# Patient Record
Sex: Female | Born: 1950
Health system: Southern US, Community
[De-identification: ages and names within clinical notes are randomized; demographics above are authoritative.]

## PROBLEM LIST (undated history)

## (undated) DIAGNOSIS — E119 Type 2 diabetes mellitus without complications: Secondary | ICD-10-CM

## (undated) DIAGNOSIS — E1169 Type 2 diabetes mellitus with other specified complication: Secondary | ICD-10-CM

## (undated) DIAGNOSIS — R0602 Shortness of breath: Secondary | ICD-10-CM

## (undated) DIAGNOSIS — F324 Major depressive disorder, single episode, in partial remission: Secondary | ICD-10-CM

## (undated) DIAGNOSIS — I5032 Chronic diastolic (congestive) heart failure: Secondary | ICD-10-CM

## (undated) DIAGNOSIS — R079 Chest pain, unspecified: Secondary | ICD-10-CM

## (undated) DIAGNOSIS — R0982 Postnasal drip: Secondary | ICD-10-CM

## (undated) DIAGNOSIS — I119 Hypertensive heart disease without heart failure: Secondary | ICD-10-CM

## (undated) DIAGNOSIS — H3530 Unspecified macular degeneration: Secondary | ICD-10-CM

## (undated) DIAGNOSIS — K219 Gastro-esophageal reflux disease without esophagitis: Secondary | ICD-10-CM

## (undated) DIAGNOSIS — I10 Essential (primary) hypertension: Secondary | ICD-10-CM

## (undated) DIAGNOSIS — R112 Nausea with vomiting, unspecified: Secondary | ICD-10-CM

## (undated) DIAGNOSIS — K5909 Other constipation: Secondary | ICD-10-CM

## (undated) DIAGNOSIS — M940 Chondrocostal junction syndrome [Tietze]: Secondary | ICD-10-CM

## (undated) DIAGNOSIS — I249 Acute ischemic heart disease, unspecified: Secondary | ICD-10-CM

## (undated) DIAGNOSIS — C50919 Malignant neoplasm of unspecified site of unspecified female breast: Secondary | ICD-10-CM

## (undated) DIAGNOSIS — E785 Hyperlipidemia, unspecified: Secondary | ICD-10-CM

## (undated) HISTORY — DX: Tobacco use: Z72.0

## (undated) HISTORY — DX: Hyperlipidemia, unspecified: E78.5

## (undated) HISTORY — DX: Major depressive disorder, single episode, in partial remission: F32.4

## (undated) HISTORY — DX: Dorsopathy, unspecified: M53.9

## (undated) HISTORY — DX: Edema, unspecified: R60.9

## (undated) HISTORY — DX: Chest pain, unspecified: R07.9

## (undated) HISTORY — DX: Other constipation: K59.09

## (undated) HISTORY — DX: Malignant neoplasm of unspecified site of unspecified female breast: C50.919

## (undated) HISTORY — PX: CATARACT EXTRACTION: SUR2

## (undated) HISTORY — DX: Unspecified macular degeneration: H35.30

## (undated) HISTORY — DX: Nausea with vomiting, unspecified: R11.2

## (undated) HISTORY — DX: Type 2 diabetes mellitus with other specified complication: E11.69

## (undated) HISTORY — DX: Shortness of breath: R06.02

## (undated) HISTORY — DX: Type 2 diabetes mellitus without complications: E11.9

## (undated) HISTORY — DX: Chronic obstructive pulmonary disease, unspecified: J44.9

## (undated) HISTORY — DX: Gastro-esophageal reflux disease without esophagitis: K21.9

## (undated) HISTORY — DX: Chronic diastolic (congestive) heart failure: I50.32

## (undated) HISTORY — DX: Chondrocostal junction syndrome (tietze): M94.0

## (undated) HISTORY — DX: Allergic rhinitis, unspecified: J30.9

## (undated) HISTORY — PX: CHOLECYSTECTOMY: SHX55

## (undated) HISTORY — DX: Acute ischemic heart disease, unspecified: I24.9

## (undated) HISTORY — DX: Postnasal drip: R09.82

## (undated) HISTORY — DX: Hypertensive heart disease without heart failure: I11.9

## (undated) HISTORY — DX: Essential (primary) hypertension: I10

---

## 1986-11-27 HISTORY — PX: VAGINAL HYSTERECTOMY: SHX2639

## 2003-11-28 DIAGNOSIS — C50919 Malignant neoplasm of unspecified site of unspecified female breast: Secondary | ICD-10-CM

## 2003-11-28 HISTORY — DX: Malignant neoplasm of unspecified site of unspecified female breast: C50.919

## 2012-06-08 ENCOUNTER — Other Ambulatory Visit: Payer: Self-pay | Admitting: Family Medicine

## 2013-11-27 HISTORY — PX: HERNIA REPAIR: SHX51

## 2016-06-23 DIAGNOSIS — Z86000 Personal history of in-situ neoplasm of breast: Secondary | ICD-10-CM

## 2016-06-23 DIAGNOSIS — Z170 Estrogen receptor positive status [ER+]: Secondary | ICD-10-CM

## 2017-06-22 DIAGNOSIS — Z86000 Personal history of in-situ neoplasm of breast: Secondary | ICD-10-CM | POA: Diagnosis not present

## 2017-06-22 DIAGNOSIS — Z170 Estrogen receptor positive status [ER+]: Secondary | ICD-10-CM | POA: Diagnosis not present

## 2017-09-21 ENCOUNTER — Encounter: Payer: Self-pay | Admitting: *Deleted

## 2017-09-24 DIAGNOSIS — R0602 Shortness of breath: Secondary | ICD-10-CM

## 2017-09-24 DIAGNOSIS — I119 Hypertensive heart disease without heart failure: Secondary | ICD-10-CM | POA: Insufficient documentation

## 2017-09-24 HISTORY — DX: Shortness of breath: R06.02

## 2017-09-24 HISTORY — DX: Hypertensive heart disease without heart failure: I11.9

## 2017-09-24 NOTE — Progress Notes (Signed)
Cardiology Office Note:    Date:  09/25/2017   ID:  Tracy Wagner, DOB 05-Oct-1951, MRN [id number]  PCP:  Cyndy Freeze, MD  Cardiologist:  Shirlee More, MD   Referring MD: Rochel Brome, MD  ASSESSMENT:    1. SOB (shortness of breath)   2. Hypertensive heart disease, unspecified whether heart failure present    PLAN:    In order of problems listed above:  1. Clinically appears to have heart failure despite a low  BNP level.  She will stop her calcium channel blocker start a loop diuretic and undergo echocardiogram.  I am also concerned regarding CAD with exertional chest pain myocardial perfusion imaging is ordered.  If she has high risk markers coronary angiography need to be considered. 2. Discontinue calcium channel blocker with edema heart failure start loop diuretic.  Next appointment in 4 weeks   Medication Adjustments/Labs and Tests Ordered: Current medicines are reviewed at length with the patient today.  Concerns regarding medicines are outlined above.  Orders Placed This Encounter  Procedures  . DG Chest 2 View  . MYOCARDIAL PERFUSION IMAGING  . EKG 12-Lead  . ECHOCARDIOGRAM COMPLETE   Meds ordered this encounter  Medications  . furosemide (LASIX) 20 MG tablet    Sig: Take 1 tablet (20 mg total) by mouth daily.    Dispense:  30 tablet    Refill:  3     Chief Complaint  Patient presents with  . Edema  . Hypertension    History of Present Illness:    Tracy Wagner is a 66 y.o. female with T2DM hypertension and hyperlipidemia who is being seen today for the evaluation of edema and SOB taking a CCB and Actos at the request of Cyndy Freeze MD. Actos was discontinued and her edema is improved but persistent.  With activity she is short of breath walking uphill to distance such as 50 yards stairs.  She has no orthopnea PND cough or wheezing.  She also he gets discomfort in her chest which is both momentary stabbing that she attributes to previous surgery  as well as aching in the chest left precordium moderate severity with activity relieved with rest and without radiation.  She has never had an ischemia evaluation.  She is at increased cardiovascular risk with diabetes hypertension hyperlipidemia as well as breast cancer and left chest radiation.  Further evaluation should undergo echocardiography study.   Past Medical History:  Diagnosis Date  . Allergic rhinitis with postnasal drip   . Breast cancer ([place])    lumpectomy and XRT left breast  . Chronic constipation   . COPD mixed type ([place])   . Gastroesophageal reflux disease without esophagitis   . Hypertension, benign   . Multilevel degenerative disc disease   . Pitting edema   . Tobacco abuse   . Type 2 diabetes mellitus with hyperlipidemia [facility])     Past Surgical History:  Procedure Laterality Date  . CHOLECYSTECTOMY    . HERNIA REPAIR  2015  . VAGINAL HYSTERECTOMY  1988    Current Medications: Current Meds  Medication Sig  . albuterol (PROVENTIL HFA;VENTOLIN HFA) 108 (90 Base) MCG/ACT inhaler Inhale 2 puffs into the lungs daily.  Marland Kitchen amitriptyline (ELAVIL) 25 MG tablet at bedtime.  . Blood Glucose Calibration (BAYER CONTOUR VI) by In Vitro route daily.  Marland Kitchen buPROPion (WELLBUTRIN XL) 150 MG 24 hr tablet Take 150 mg by mouth daily.  . celecoxib (CELEBREX) 200 MG capsule Take 600 mg by mouth  daily.   . dexlansoprazole (DEXILANT) 60 MG capsule Take 60 mg by mouth daily.  Marland Kitchen glucose blood test strip 1 each by Other route as needed for other. Use as instructed  . metFORMIN (GLUCOPHAGE) 1000 MG tablet Take 1,000 mg by mouth 2 (two) times daily with a meal.  . MICROLET LANCETS MISC by Does not apply route daily.  . polyethylene glycol powder (GLYCOLAX/MIRALAX) powder   . rosuvastatin (CRESTOR) 20 MG tablet Take 20 mg by mouth daily.  Marland Kitchen spironolactone (ALDACTONE) 25 MG tablet Take 25 mg by mouth daily.  . Vitamin D, Ergocalciferol, (DRISDOL) 50000 units CAPS capsule Take 50,000 Units by  mouth every 7 (seven) days.  . [DISCONTINUED] amLODipine (NORVASC) 10 MG tablet Take 10 mg by mouth daily.     Allergies:   Sulfa antibiotics   Social History   Social History  . Marital status: Married    Spouse name: N/A  . Number of children: N/A  . Years of education: N/A   Social History Main Topics  . Smoking status: Former Smoker    Quit date: 09/22/1987  . Smokeless tobacco: Never Used  . Alcohol use No  . Drug use: No  . Sexual activity: Not Asked   Other Topics Concern  . None   Social History Narrative  . None     Family History: The patient's family history includes Alzheimer's disease in her father; Diabetes in her sister; Heart attack in her father; Hypertension in her father and mother; Prostate cancer in her brother; Stroke in her father; Throat cancer in her mother.  ROS:   Review of Systems  Constitution: Negative.  HENT: Negative.   Eyes: Negative.   Cardiovascular: Positive for chest pain. Negative for claudication, cyanosis, dyspnea on exertion, irregular heartbeat, leg swelling, near-syncope, orthopnea, paroxysmal nocturnal dyspnea and syncope.  Respiratory: Positive for shortness of breath.   Endocrine: Negative.   Skin: Negative.   Musculoskeletal: Negative.   Gastrointestinal: Negative.   Genitourinary: Negative.   Neurological: Negative.   Psychiatric/Behavioral: Negative.   Allergic/Immunologic: Negative.    Please see the history of present illness.      EKGs/Labs/Other Studies Reviewed:    The following studies were reviewed today:   EKG:  EKG is  ordered today.  The ekg ordered today demonstrates sinus rhythm normal  Recent Labs: BMP normal, TSH normal, BNP 7.8, troponin < 0.01 No results found for requested labs within last 8760 hours.  Recent Lipid Panel No results found for: CHOL, TRIG, HDL, CHOLHDL, VLDL, LDLCALC, LDLDIRECT  Physical Exam:    VS:  BP 140/82 (BP Location: Right Arm, Patient Position: Sitting, Cuff Size:  Normal)   Pulse (!) 101   Ht 5\' 2"  (1.575 m)   Wt 191 lb (86.6 kg)   SpO2 96%   BMI 34.93 kg/m     Wt Readings from Last 3 Encounters:  09/25/17 191 lb (86.6 kg)     GEN:  Well nourished, well developed in no acute distress HEENT: Normal NECK: No JVD; No carotid bruits LYMPHATICS: No lymphadenopathy CARDIAC: RRR, no murmurs, rubs, gallops RESPIRATORY:  Clear to auscultation without rales, wheezing or rhonchi  ABDOMEN: Soft, non-tender, non-distended MUSCULOSKELETAL:  No edema; No deformity  SKIN: Warm and dry NEUROLOGIC:  Alert and oriented x 3 PSYCHIATRIC:  Normal affect     Signed, Shirlee More, MD  09/25/2017 5:12 PM    [facility]

## 2017-09-25 ENCOUNTER — Ambulatory Visit (INDEPENDENT_AMBULATORY_CARE_PROVIDER_SITE_OTHER): Payer: BLUE CROSS/BLUE SHIELD | Admitting: Cardiology

## 2017-09-25 ENCOUNTER — Encounter (INDEPENDENT_AMBULATORY_CARE_PROVIDER_SITE_OTHER): Payer: Self-pay

## 2017-09-25 ENCOUNTER — Ambulatory Visit (HOSPITAL_BASED_OUTPATIENT_CLINIC_OR_DEPARTMENT_OTHER)
Admission: RE | Admit: 2017-09-25 | Discharge: 2017-09-25 | Disposition: A | Discharge status: Home / Self Care | Payer: BLUE CROSS/BLUE SHIELD | Source: Ambulatory Visit | Attending: Cardiology | Admitting: Cardiology

## 2017-09-25 ENCOUNTER — Encounter: Payer: Self-pay | Admitting: Cardiology

## 2017-09-25 DIAGNOSIS — R0602 Shortness of breath: Secondary | ICD-10-CM | POA: Insufficient documentation

## 2017-09-25 DIAGNOSIS — I119 Hypertensive heart disease without heart failure: Secondary | ICD-10-CM | POA: Diagnosis not present

## 2017-09-25 DIAGNOSIS — J410 Simple chronic bronchitis: Secondary | ICD-10-CM | POA: Insufficient documentation

## 2017-09-25 DIAGNOSIS — F17200 Nicotine dependence, unspecified, uncomplicated: Secondary | ICD-10-CM | POA: Diagnosis not present

## 2017-09-25 MED ORDER — FUROSEMIDE 20 MG PO TABS: 20.0000 mg | ORAL_TABLET | Freq: Every day | ORAL | 3 refills | Status: DC

## 2017-09-25 NOTE — Patient Instructions (Signed)
Medication Instructions:  Your physician has recommended you make the following change in your medication:  STOP amlodipine START furosemide (Lasix) 20 mg daily  Labwork: None  Testing/Procedures: Your physician has requested that you have an echocardiogram. Echocardiography is a painless test that uses sound waves to create images of your heart. It provides your doctor with information about the size and shape of your heart and how well your heart's chambers and valves are working. This procedure takes approximately one hour. There are no restrictions for this procedure.  Your physician has requested that you have en exercise stress myoview. For further information please visit [email or web address]. Please follow instruction sheet, as given.  A chest x-ray takes a picture of the organs and structures inside the chest, including the heart, lungs, and blood vessels. This test can show several things, including, whether the heart is enlarges; whether fluid is building up in the lungs; and whether pacemaker / defibrillator leads are still in place.  Follow-Up: Your physician recommends that you schedule a follow-up appointment in: 4 weeks.  Any Other Special Instructions Will Be Listed Below (If Applicable).     If you need a refill on your cardiac medications before your next appointment, please call your pharmacy.

## 2017-09-27 DIAGNOSIS — E119 Type 2 diabetes mellitus without complications: Secondary | ICD-10-CM | POA: Diagnosis not present

## 2017-09-27 DIAGNOSIS — R0789 Other chest pain: Secondary | ICD-10-CM | POA: Diagnosis not present

## 2017-09-27 DIAGNOSIS — R079 Chest pain, unspecified: Secondary | ICD-10-CM | POA: Diagnosis not present

## 2017-09-28 DIAGNOSIS — I10 Essential (primary) hypertension: Secondary | ICD-10-CM | POA: Diagnosis not present

## 2017-09-28 DIAGNOSIS — I249 Acute ischemic heart disease, unspecified: Secondary | ICD-10-CM | POA: Diagnosis not present

## 2017-09-28 DIAGNOSIS — R079 Chest pain, unspecified: Secondary | ICD-10-CM | POA: Diagnosis not present

## 2017-10-03 ENCOUNTER — Telehealth (HOSPITAL_COMMUNITY): Payer: Self-pay | Admitting: *Deleted

## 2017-10-03 NOTE — Telephone Encounter (Signed)
Called to give patient instructions for upcoming nuclear test. Patient wants to cancel, states she had her tests( Nuclear and Echo) done last week in [place].  Kirstie Peri

## 2017-10-09 ENCOUNTER — Other Ambulatory Visit (HOSPITAL_COMMUNITY): Payer: BLUE CROSS/BLUE SHIELD

## 2017-10-09 ENCOUNTER — Encounter (HOSPITAL_COMMUNITY): Payer: BLUE CROSS/BLUE SHIELD

## 2017-10-11 NOTE — Progress Notes (Signed)
Cardiology Office Note:    Date:  10/12/2017   ID:  Tracy Wagner, DOB 08-13-1951, MRN [id number]  PCP:  Cyndy Freeze, MD  Cardiologist:  Shirlee More, MD    Referring MD: Cyndy Freeze, MD    ASSESSMENT:    1. Hypertensive heart disease with chronic diastolic congestive heart failure ([place])   2. Costochondritis   3. Non-intractable vomiting with nausea, unspecified vomiting type    PLAN:    In order of problems listed above:  1. Her physical examination is consistent with costochondritis.  Her recent myocardial perfusion study was normal her symptoms have been improved and she has been on a nonsteroidal anti-inflammatory drug for several months.  I have asked her to discontinue it with her nausea and vomiting and heartburn I offered her physical therapy modalities she said she does not think the symptoms are severe enough and she will use Tylenol as needed.  I do not think she requires further referral such as coronary arteriography. 2. Likely due to long-term Celebrex therapy I asked her to discontinue and continue her PPI 3. Improved no longer short of breath breath blood pressure target and she will continue her diuretic.   Next appointment: One month   Medication Adjustments/Labs and Tests Ordered: Current medicines are reviewed at length with the patient today.  Concerns regarding medicines are outlined above.  No orders of the defined types were placed in this encounter.  Meds ordered this encounter  Medications  . acetaminophen (TYLENOL) 325 MG tablet    Sig: Take 2 tablets (650 mg total) every 8 (eight) hours as needed by mouth.    Dispense:  30 tablet    Refill:  3    Chief Complaint  Patient presents with  . Hospitalization Follow-up    History of Present Illness:    Tracy Wagner is a 66 y.o. female with a hx of heart failure, T2DM hypertension and hyperlipidemia last seen 2 weeks ago.  In the interim she has met [facility] with chest pain  there is no evidence of acute coronary syndrome in both echocardiogram and myocardial perfusion study were felt to be normal although there is no discussion of whether or not she had diastolic heart failure Compliance with diet, lifestyle and medications: Yes She is improved but unsure of her diagnosis.  When I had seen her last she had evidence of diastolic heart failure I placed her on a small dose of loop diuretic and her shortness of breath and edema have resolved.  Echocardiogram shows normal left ventricular ejection fraction.  Her chest pain is markedly improved but she still has reproducible chest wall tenderness in the left side and will have offered at the time being is reassurance and symptoms worsen referral for physical therapy modalities.  She is having dyspepsia heartburn nausea and vomiting I asked her to stop the nonsteroidal anti-inflammatory drug.  In particular I do not think she needs referral for coronary angiography.  She had extensive evaluation including CTA no evidence of pulmonary embolism echocardiogram and myocardial perfusion study during recent [facility] visit.  Those records including admission discharge echo CTA labs and myocardial perfusion study reviewed prior to visit Past Medical History:  Diagnosis Date  . Allergic rhinitis with postnasal drip   . Breast cancer ([place])    lumpectomy and XRT left breast  . Chronic constipation   . COPD mixed type ([place])   . Gastroesophageal reflux disease without esophagitis   . Hypertension, benign   .  Multilevel degenerative disc disease   . Pitting edema   . Tobacco abuse   . Type 2 diabetes mellitus with hyperlipidemia [facility])     Past Surgical History:  Procedure Laterality Date  . CHOLECYSTECTOMY    . HERNIA REPAIR  2015  . VAGINAL HYSTERECTOMY  1988    Current Medications: Current Meds  Medication Sig  . albuterol (PROVENTIL HFA;VENTOLIN HFA) 108 (90 Base) MCG/ACT inhaler Inhale 2 puffs into the lungs daily.    Marland Kitchen amitriptyline (ELAVIL) 25 MG tablet at bedtime.  . Blood Glucose Calibration (BAYER CONTOUR VI) by In Vitro route daily.  Marland Kitchen buPROPion (WELLBUTRIN XL) 150 MG 24 hr tablet Take 150 mg 2 (two) times daily by mouth.   . dexlansoprazole (DEXILANT) 60 MG capsule Take 60 mg by mouth daily.  . furosemide (LASIX) 20 MG tablet Take 1 tablet (20 mg total) by mouth daily.  Marland Kitchen glucose blood test strip 1 each by Other route as needed for other. Use as instructed  . levofloxacin (LEVAQUIN) 500 MG tablet daily.  . metFORMIN (GLUCOPHAGE) 1000 MG tablet Take 1,000 mg by mouth 2 (two) times daily with a meal.  . MICROLET LANCETS MISC by Does not apply route daily.  . ondansetron (ZOFRAN) 8 MG tablet 3 (three) times daily.  . polyethylene glycol powder (GLYCOLAX/MIRALAX) powder   . rosuvastatin (CRESTOR) 20 MG tablet Take 20 mg by mouth daily.  Marland Kitchen spironolactone (ALDACTONE) 25 MG tablet Take 25 mg by mouth daily.  . Vitamin D, Ergocalciferol, (DRISDOL) 50000 units CAPS capsule Take 50,000 Units by mouth every 7 (seven) days.  . [DISCONTINUED] celecoxib (CELEBREX) 200 MG capsule Take 600 mg by mouth daily.      Allergies:   Sulfa antibiotics   Social History   Socioeconomic History  . Marital status: Married    Spouse name: Not on file  . Number of children: Not on file  . Years of education: Not on file  . Highest education level: Not on file  Social Needs  . Financial resource strain: Not on file  . Food insecurity - worry: Not on file  . Food insecurity - inability: Not on file  . Transportation needs - medical: Not on file  . Transportation needs - non-medical: Not on file  Occupational History  . Not on file  Tobacco Use  . Smoking status: Former Smoker    Last attempt to quit: 09/22/1987    Years since quitting: 30.0  . Smokeless tobacco: Never Used  Substance and Sexual Activity  . Alcohol use: No  . Drug use: No  . Sexual activity: Not on file  Other Topics Concern  . Not on file   Social History Narrative  . Not on file     Family History: The patient's family history includes Alzheimer's disease in her father; Diabetes in her sister; Heart attack in her father; Hypertension in her father and mother; Prostate cancer in her brother; Stroke in her father; Throat cancer in her mother.  She has tenderness along the costochondral junction bilaterally left greater than right ROS:   Please see the history of present illness.    All other systems reviewed and are negative.  EKGs/Labs/Other Studies Reviewed:    The following studies were reviewed today:  Echocardiogram 10/06/2017 shows mild concentric LVH normal systolic function diastolic function is not described no other valvular abnormality noted MPI, performed pharmacologically with Lexiscan shows an ejection fraction of 68% breast attenuation no ischemia normal for female EKG performed 09/27/2017  shows sinus tachycardia otherwise normal Recent Labs: 1118 CBC normal CMP normal TSH normal troponin repeat undetectable cholesterol 95 triglyceride 175 no BNP level was assessed CT of the chest shows no evidence of pulmonary embolism No results found for requested labs within last 8760 hours.  Recent Lipid Panel No results found for: CHOL, TRIG, HDL, CHOLHDL, VLDL, LDLCALC, LDLDIRECT  Physical Exam:    VS:  BP 110/70 (BP Location: Right Arm, Patient Position: Sitting, Cuff Size: Normal)   Pulse 95   Ht '5\' 2"'$  (1.575 m)   Wt 187 lb (84.8 kg)   SpO2 96%   BMI 34.20 kg/m     Wt Readings from Last 3 Encounters:  10/12/17 187 lb (84.8 kg)  09/25/17 191 lb (86.6 kg)     GEN:  Well nourished, well developed in no acute distress HEENT: Normal NECK: No JVD; No carotid bruits LYMPHATICS: No lymphadenopathy CARDIAC: RRR, no murmurs, rubs, gallops RESPIRATORY:  Clear to auscultation without rales, wheezing or rhonchi that reproduces her chest wall complaint. ABDOMEN: Soft, non-tender, non-distended MUSCULOSKELETAL:  No  edema; No deformity  SKIN: Warm and dry NEUROLOGIC:  Alert and oriented x 3 PSYCHIATRIC:  Normal affect    Signed, Shirlee More, MD  10/12/2017 3:08 PM    [facility]

## 2017-10-12 ENCOUNTER — Ambulatory Visit: Payer: BLUE CROSS/BLUE SHIELD | Admitting: Cardiology

## 2017-10-12 VITALS — BP 110/70 | HR 95 | Ht 62.0 in | Wt 187.0 lb

## 2017-10-12 DIAGNOSIS — R079 Chest pain, unspecified: Secondary | ICD-10-CM

## 2017-10-12 DIAGNOSIS — I5032 Chronic diastolic (congestive) heart failure: Secondary | ICD-10-CM

## 2017-10-12 DIAGNOSIS — M940 Chondrocostal junction syndrome [Tietze]: Secondary | ICD-10-CM

## 2017-10-12 DIAGNOSIS — I110 Hypertensive heart disease with heart failure: Secondary | ICD-10-CM | POA: Diagnosis not present

## 2017-10-12 DIAGNOSIS — R112 Nausea with vomiting, unspecified: Secondary | ICD-10-CM | POA: Insufficient documentation

## 2017-10-12 HISTORY — DX: Nausea with vomiting, unspecified: R11.2

## 2017-10-12 HISTORY — DX: Chest pain, unspecified: R07.9

## 2017-10-12 MED ORDER — ACETAMINOPHEN 325 MG PO TABS: 650.0000 mg | ORAL_TABLET | Freq: Three times a day (TID) | ORAL | 3 refills | Status: DC | PRN

## 2017-10-12 NOTE — Patient Instructions (Addendum)
Medication Instructions:  Your physician has recommended you make the following change in your medication:  STOP celebrex START over the counter Tylenol every 8 hours as needed  Labwork: None  Testing/Procedures: None  Follow-Up: Your physician recommends that you schedule a follow-up appointment in: 1 month.  Any Other Special Instructions Will Be Listed Below (If Applicable).     If you need a refill on your cardiac medications before your next appointment, please call your pharmacy.    Costochondritis Costochondritis is swelling and irritation (inflammation) of the tissue (cartilage) that connects your ribs to your breastbone (sternum). This causes pain in the front of your chest. Usually, the pain:  Starts gradually.  Is in more than one rib.  This condition usually goes away on its own over time. Follow these instructions at home:  Do not do anything that makes your pain worse.  If directed, put ice on the painful area: ? Put ice in a plastic bag. ? Place a towel between your skin and the bag. ? Leave the ice on for 20 minutes, 2-3 times a day.  If directed, put heat on the affected area as often as told by your doctor. Use the heat source that your doctor tells you to use, such as a moist heat pack or a heating pad. ? Place a towel between your skin and the heat source. ? Leave the heat on for 20-30 minutes. ? Take off the heat if your skin turns bright red. This is very important if you cannot feel pain, heat, or cold. You may have a greater risk of getting burned.  Take over-the-counter and prescription medicines only as told by your doctor.  Return to your normal activities as told by your doctor. Ask your doctor what activities are safe for you.  Keep all follow-up visits as told by your doctor. This is important. Contact a doctor if:  You have chills or a fever.  Your pain does not go away or it gets worse.  You have a cough that does not go  away. Get help right away if:  You are short of breath. This information is not intended to replace advice given to you by your health care provider. Make sure you discuss any questions you have with your health care provider. Document Released: 05/01/2008 Document Revised: 06/02/2016 Document Reviewed: 03/08/2016 Elsevier Interactive Patient Education  [employer].

## 2017-10-23 ENCOUNTER — Ambulatory Visit: Payer: BLUE CROSS/BLUE SHIELD | Admitting: Cardiology

## 2017-10-29 DIAGNOSIS — K219 Gastro-esophageal reflux disease without esophagitis: Secondary | ICD-10-CM | POA: Diagnosis not present

## 2017-10-29 DIAGNOSIS — Z6833 Body mass index (BMI) 33.0-33.9, adult: Secondary | ICD-10-CM | POA: Diagnosis not present

## 2017-10-29 DIAGNOSIS — Z760 Encounter for issue of repeat prescription: Secondary | ICD-10-CM | POA: Diagnosis not present

## 2017-10-29 DIAGNOSIS — R112 Nausea with vomiting, unspecified: Secondary | ICD-10-CM | POA: Diagnosis not present

## 2017-10-29 DIAGNOSIS — F3341 Major depressive disorder, recurrent, in partial remission: Secondary | ICD-10-CM | POA: Diagnosis not present

## 2017-11-12 ENCOUNTER — Ambulatory Visit: Payer: BLUE CROSS/BLUE SHIELD | Admitting: Cardiology

## 2017-12-10 DIAGNOSIS — J441 Chronic obstructive pulmonary disease with (acute) exacerbation: Secondary | ICD-10-CM | POA: Diagnosis not present

## 2017-12-10 DIAGNOSIS — Z6834 Body mass index (BMI) 34.0-34.9, adult: Secondary | ICD-10-CM | POA: Diagnosis not present

## 2017-12-10 DIAGNOSIS — J069 Acute upper respiratory infection, unspecified: Secondary | ICD-10-CM | POA: Diagnosis not present

## 2017-12-13 DIAGNOSIS — K219 Gastro-esophageal reflux disease without esophagitis: Secondary | ICD-10-CM | POA: Diagnosis not present

## 2017-12-13 DIAGNOSIS — Z6834 Body mass index (BMI) 34.0-34.9, adult: Secondary | ICD-10-CM | POA: Diagnosis not present

## 2017-12-13 DIAGNOSIS — R112 Nausea with vomiting, unspecified: Secondary | ICD-10-CM | POA: Diagnosis not present

## 2017-12-13 DIAGNOSIS — J069 Acute upper respiratory infection, unspecified: Secondary | ICD-10-CM | POA: Diagnosis not present

## 2017-12-25 DIAGNOSIS — Z6834 Body mass index (BMI) 34.0-34.9, adult: Secondary | ICD-10-CM | POA: Diagnosis not present

## 2017-12-25 DIAGNOSIS — R0982 Postnasal drip: Secondary | ICD-10-CM | POA: Diagnosis not present

## 2017-12-25 DIAGNOSIS — J309 Allergic rhinitis, unspecified: Secondary | ICD-10-CM | POA: Diagnosis not present

## 2017-12-25 DIAGNOSIS — R05 Cough: Secondary | ICD-10-CM | POA: Diagnosis not present

## 2017-12-28 DIAGNOSIS — Z6833 Body mass index (BMI) 33.0-33.9, adult: Secondary | ICD-10-CM | POA: Diagnosis not present

## 2017-12-28 DIAGNOSIS — J441 Chronic obstructive pulmonary disease with (acute) exacerbation: Secondary | ICD-10-CM | POA: Diagnosis not present

## 2018-01-10 DIAGNOSIS — I5032 Chronic diastolic (congestive) heart failure: Secondary | ICD-10-CM | POA: Diagnosis not present

## 2018-01-10 DIAGNOSIS — Z6833 Body mass index (BMI) 33.0-33.9, adult: Secondary | ICD-10-CM | POA: Diagnosis not present

## 2018-01-10 DIAGNOSIS — R0789 Other chest pain: Secondary | ICD-10-CM | POA: Diagnosis not present

## 2018-01-10 DIAGNOSIS — F3341 Major depressive disorder, recurrent, in partial remission: Secondary | ICD-10-CM | POA: Diagnosis not present

## 2018-01-10 DIAGNOSIS — K219 Gastro-esophageal reflux disease without esophagitis: Secondary | ICD-10-CM | POA: Diagnosis not present

## 2018-01-10 DIAGNOSIS — R1084 Generalized abdominal pain: Secondary | ICD-10-CM | POA: Diagnosis not present

## 2018-01-10 DIAGNOSIS — E1169 Type 2 diabetes mellitus with other specified complication: Secondary | ICD-10-CM | POA: Diagnosis not present

## 2018-01-10 DIAGNOSIS — E785 Hyperlipidemia, unspecified: Secondary | ICD-10-CM | POA: Diagnosis not present

## 2018-01-10 DIAGNOSIS — I110 Hypertensive heart disease with heart failure: Secondary | ICD-10-CM | POA: Diagnosis not present

## 2018-01-10 DIAGNOSIS — R0602 Shortness of breath: Secondary | ICD-10-CM | POA: Diagnosis not present

## 2018-01-15 DIAGNOSIS — K5909 Other constipation: Secondary | ICD-10-CM | POA: Insufficient documentation

## 2018-01-15 DIAGNOSIS — M940 Chondrocostal junction syndrome [Tietze]: Secondary | ICD-10-CM

## 2018-01-15 DIAGNOSIS — E119 Type 2 diabetes mellitus without complications: Secondary | ICD-10-CM

## 2018-01-15 DIAGNOSIS — E1169 Type 2 diabetes mellitus with other specified complication: Secondary | ICD-10-CM | POA: Insufficient documentation

## 2018-01-15 DIAGNOSIS — I5032 Chronic diastolic (congestive) heart failure: Secondary | ICD-10-CM

## 2018-01-15 DIAGNOSIS — K219 Gastro-esophageal reflux disease without esophagitis: Secondary | ICD-10-CM

## 2018-01-15 DIAGNOSIS — J449 Chronic obstructive pulmonary disease, unspecified: Secondary | ICD-10-CM

## 2018-01-15 DIAGNOSIS — F324 Major depressive disorder, single episode, in partial remission: Secondary | ICD-10-CM

## 2018-01-15 DIAGNOSIS — I249 Acute ischemic heart disease, unspecified: Secondary | ICD-10-CM | POA: Insufficient documentation

## 2018-01-15 HISTORY — DX: Chronic obstructive pulmonary disease, unspecified: J44.9

## 2018-01-15 HISTORY — DX: Major depressive disorder, single episode, in partial remission: F32.4

## 2018-01-15 HISTORY — DX: Chondrocostal junction syndrome (tietze): M94.0

## 2018-01-15 HISTORY — DX: Chronic diastolic (congestive) heart failure: I50.32

## 2018-01-15 HISTORY — DX: Type 2 diabetes mellitus without complications: E11.9

## 2018-01-15 HISTORY — DX: Gastro-esophageal reflux disease without esophagitis: K21.9

## 2018-01-15 HISTORY — DX: Acute ischemic heart disease, unspecified: I24.9

## 2018-01-27 DIAGNOSIS — R0789 Other chest pain: Secondary | ICD-10-CM | POA: Insufficient documentation

## 2018-01-27 DIAGNOSIS — R071 Chest pain on breathing: Secondary | ICD-10-CM

## 2018-01-27 NOTE — Progress Notes (Signed)
Cardiology Office Note:    Date:  01/28/2018   ID:  CICILIA CLINGER, DOB [birth year], MRN [id number]  PCP:  Cyndy Freeze, MD  Cardiologist:  Shirlee More, MD    Referring MD: Cyndy Freeze, MD    ASSESSMENT:    1. Chest pain, unspecified type   2. Chronic obstructive pulmonary disease, unspecified COPD type ([place])   3. Chronic diastolic heart failure ([place])   4. Hypertensive heart disease with chronic diastolic congestive heart failure ([place])    PLAN:    In order of problems listed above:  1. Presentation is consistent with recurrent costochondritis I asked her to resume her Celebrex she has at home and if refractory we can refer to physical therapy modalities.  October 2018 showed a normal myocardial perfusion study I would not repeat an ischemic evaluation at this time 2. Worsened with cough shortness of breath and bronchospasm.  Managed by PCP 3. Stable no volume overload continue her diuretic 4. Stable continue current treatment for hypertension   Next appointment: As needed   Medication Adjustments/Labs and Tests Ordered: Current medicines are reviewed at length with the patient today.  Concerns regarding medicines are outlined above.  Orders Placed This Encounter  Procedures  . EKG 12-Lead   No orders of the defined types were placed in this encounter.   Chief Complaint  Patient presents with  . New Patient (Initial Visit)    per Dr Cathi Roan to evaluate CP   . Chest Pain    History of Present Illness:    NALANIE WINIECKI is a 67 y.o. female with a hx of COPD, heart failure, T2DM hypertension and hyperlipidemia  last seen four months ago. She was seen as an out patient Dr Cathi Roan 01/10/18 with chest pain, troponin was normal.   10/12/17: ASSESSMENT:    1. Hypertensive heart disease with chronic diastolic congestive heart failure ([place])   2. Costochondritis   3. Non-intractable vomiting with nausea, unspecified vomiting type    PLAN:    1. Her  physical examination is consistent with costochondritis.  Her recent myocardial perfusion study was normal her symptoms have been improved and she has been on a nonsteroidal anti-inflammatory drug for several months.  I have asked her to discontinue it with her nausea and vomiting and heartburn I offered her physical therapy modalities she said she does not think the symptoms are severe enough and she will use Tylenol as needed.  I do not think she requires further referral such as coronary arteriography. 2. Likely due to long-term Celebrex therapy I asked her to discontinue and continue her PPI 3. Improved no longer short of breath breath blood pressure target and she will continue her diuretic.  Compliance with diet, lifestyle and medications: Yes  She has had 1 month of cough wheezing chest pain that was sharp localized predominantly over the costochondral junction of the left she also has had vomiting in the mornings.  She has had no edema orthopnea but is short of breath walking is little is 100 foot.  She had an ischemia evaluation and normal myocardial perfusion study October 2018.  Her symptoms are not exertional or anginal in nature. Past Medical History:  Diagnosis Date  . ACS (acute coronary syndrome) ([place]) 01/15/2018  . Allergic rhinitis with postnasal drip   . Breast cancer ([place])    lumpectomy and XRT left breast  . Chest pain 10/12/2017  . Chronic constipation   . Chronic diastolic heart failure ([place]) 01/15/2018  . COPD (  chronic obstructive pulmonary disease) ([place]) 01/15/2018  . COPD mixed type ([place])   . Costochondritis 01/15/2018  . DM (diabetes mellitus) ([place]) 01/15/2018  . Gastroesophageal reflux disease without esophagitis   . GERD (gastroesophageal reflux disease) 01/15/2018  . Hypertension, benign   . Hypertensive heart disease 09/24/2017  . Major depressive disorder in partial remission ([place]) 01/15/2018  . Multilevel degenerative disc disease   . Nausea and vomiting 10/12/2017   . Pitting edema   . SOB (shortness of breath) 09/24/2017  . Tobacco abuse   . Type 2 diabetes mellitus with hyperlipidemia [facility])     Past Surgical History:  Procedure Laterality Date  . CHOLECYSTECTOMY    . HERNIA REPAIR  2015  . VAGINAL HYSTERECTOMY  1988    Current Medications: Current Meds  Medication Sig  . acetaminophen (TYLENOL) 325 MG tablet Take 2 tablets (650 mg total) every 8 (eight) hours as needed by mouth.  Marland Kitchen albuterol (PROVENTIL HFA;VENTOLIN HFA) 108 (90 Base) MCG/ACT inhaler Inhale 2 puffs into the lungs daily.  Marland Kitchen amitriptyline (ELAVIL) 25 MG tablet Take 25 mg by mouth at bedtime.   Marland Kitchen amLODipine (NORVASC) 10 MG tablet Take 10 mg by mouth daily.  Marland Kitchen aspirin EC 81 MG tablet Take 81 mg by mouth daily.  . Blood Glucose Calibration (BAYER CONTOUR VI) by In Vitro route daily.  Marland Kitchen buPROPion (WELLBUTRIN XL) 150 MG 24 hr tablet Take 150 mg by mouth daily.   Marland Kitchen dexlansoprazole (DEXILANT) 60 MG capsule Take 60 mg by mouth daily.  . furosemide (LASIX) 20 MG tablet Take 1 tablet (20 mg total) by mouth daily.  Marland Kitchen glipiZIDE (GLUCOTROL) 5 MG tablet Take 5 mg by mouth daily before breakfast.  . glucose blood test strip 1 each by Other route as needed for other. Use as instructed  . metFORMIN (GLUCOPHAGE) 1000 MG tablet Take 1,000 mg by mouth 2 (two) times daily with a meal.  . MICROLET LANCETS MISC by Does not apply route daily.  . ondansetron (ZOFRAN) 8 MG tablet Take 8 mg by mouth 3 (three) times daily as needed.   . polyethylene glycol powder (GLYCOLAX/MIRALAX) powder   . rosuvastatin (CRESTOR) 20 MG tablet Take 20 mg by mouth daily.  Marland Kitchen spironolactone (ALDACTONE) 25 MG tablet Take 25 mg by mouth daily.  . Vitamin D, Ergocalciferol, (DRISDOL) 50000 units CAPS capsule Take 50,000 Units by mouth every 7 (seven) days.     Allergies:   Sulfa antibiotics   Social History   Socioeconomic History  . Marital status: Married    Spouse name: None  . Number of children: None  . Years  of education: None  . Highest education level: None  Social Needs  . Financial resource strain: None  . Food insecurity - worry: None  . Food insecurity - inability: None  . Transportation needs - medical: None  . Transportation needs - non-medical: None  Occupational History  . None  Tobacco Use  . Smoking status: Former Smoker    Last attempt to quit: 09/22/1987    Years since quitting: 30.3  . Smokeless tobacco: Never Used  Substance and Sexual Activity  . Alcohol use: No  . Drug use: No  . Sexual activity: None  Other Topics Concern  . None  Social History Narrative  . None     Family History: The patient's family history includes Alzheimer's disease in her father; Diabetes in her sister; Heart attack in her father; Hypertension in her father and mother; Prostate cancer in  her brother; Stroke in her father; Throat cancer in her mother. ROS:   Please see the history of present illness.    All other systems reviewed and are negative.  EKGs/Labs/Other Studies Reviewed:    The following studies were reviewed today: EKG 26-Jan-2018: Sinus tachycardia non specific T waves EKG:  EKG ordered today.  The ekg ordered today demonstrates Sinus tachycardia OW normal  Recent Labs: No results found for requested labs within last 8760 hours.  Recent Lipid Panel No results found for: CHOL, TRIG, HDL, CHOLHDL, VLDL, LDLCALC, LDLDIRECT  Physical Exam:    VS:  BP (!) 144/82 (BP Location: Right Arm, Patient Position: Sitting, Cuff Size: Normal)   Pulse (!) 104   Ht 5\' 2"  (1.575 m)   Wt 178 lb (80.7 kg)   SpO2 97%   BMI 32.56 kg/m     Wt Readings from Last 3 Encounters:  01/28/18 178 lb (80.7 kg)  10/12/17 187 lb (84.8 kg)  09/25/17 191 lb (86.6 kg)     GEN: COPD appearance, having cough paroxysms  Well nourished, well developed in no acute distress HEENT: Normal NECK: No JVD; No carotid bruits LYMPHATICS: No lymphadenopathy CARDIAC: RRR, no murmurs, rubs, gallops tender of  CCJ bilaterally L>R RESPIRATORY:  difuse expiratory wheezing ABDOMEN: Soft, non-tender, non-distended MUSCULOSKELETAL:  No edema; No deformity  SKIN: Warm and dry NEUROLOGIC:  Alert and oriented x 3 PSYCHIATRIC:  Normal affect    Signed, Shirlee More, MD  01/28/2018 3:01 PM    [facility]

## 2018-01-28 ENCOUNTER — Ambulatory Visit (INDEPENDENT_AMBULATORY_CARE_PROVIDER_SITE_OTHER): Payer: Medicare HMO | Admitting: Cardiology

## 2018-01-28 ENCOUNTER — Encounter: Payer: Self-pay | Admitting: Cardiology

## 2018-01-28 VITALS — BP 144/82 | HR 104 | Ht 62.0 in | Wt 178.0 lb

## 2018-01-28 DIAGNOSIS — I5032 Chronic diastolic (congestive) heart failure: Secondary | ICD-10-CM | POA: Diagnosis not present

## 2018-01-28 DIAGNOSIS — J449 Chronic obstructive pulmonary disease, unspecified: Secondary | ICD-10-CM

## 2018-01-28 DIAGNOSIS — R079 Chest pain, unspecified: Secondary | ICD-10-CM | POA: Diagnosis not present

## 2018-01-28 DIAGNOSIS — I110 Hypertensive heart disease with heart failure: Secondary | ICD-10-CM | POA: Diagnosis not present

## 2018-01-28 NOTE — Patient Instructions (Addendum)
   Medication Instructions:  Your physician recommends that you continue on your current medications as directed. Please refer to the Current Medication list given to you today.  Restart your celebrex for 2 weeks   Labwork: None  Testing/Procedures: You had an EKG today.   Follow-Up: Your physician recommends that you schedule a follow-up appointment as needed if symptoms worsen or fail to improve.  Any Other Special Instructions Will Be Listed Below (If Applicable).     If you need a refill on your cardiac medications before your next appointment, please call your pharmacy.

## 2018-02-25 ENCOUNTER — Other Ambulatory Visit: Payer: Self-pay

## 2018-02-25 DIAGNOSIS — R0602 Shortness of breath: Secondary | ICD-10-CM

## 2018-02-25 MED ORDER — FUROSEMIDE 20 MG PO TABS
20.0000 mg | ORAL_TABLET | Freq: Every day | ORAL | 3 refills | Status: DC
Start: 2018-02-25 — End: 2023-04-10

## 2018-02-26 DIAGNOSIS — F3341 Major depressive disorder, recurrent, in partial remission: Secondary | ICD-10-CM | POA: Diagnosis not present

## 2018-02-26 DIAGNOSIS — Z6832 Body mass index (BMI) 32.0-32.9, adult: Secondary | ICD-10-CM | POA: Diagnosis not present

## 2018-02-26 DIAGNOSIS — I110 Hypertensive heart disease with heart failure: Secondary | ICD-10-CM | POA: Diagnosis not present

## 2018-02-26 DIAGNOSIS — I5032 Chronic diastolic (congestive) heart failure: Secondary | ICD-10-CM | POA: Diagnosis not present

## 2018-04-03 IMAGING — DX DG CHEST 2V
2 series · 2 of 2 positions shown · non-contrast
Comparison: None in PACs

CLINICAL DATA: Shortness of breath for the past 2 weeks associated
with peripheral edema. History of COPD, former smoker, hypertension,
diabetes, breast malignancy.

EXAM:
CHEST  2 VIEW

[chest pa]
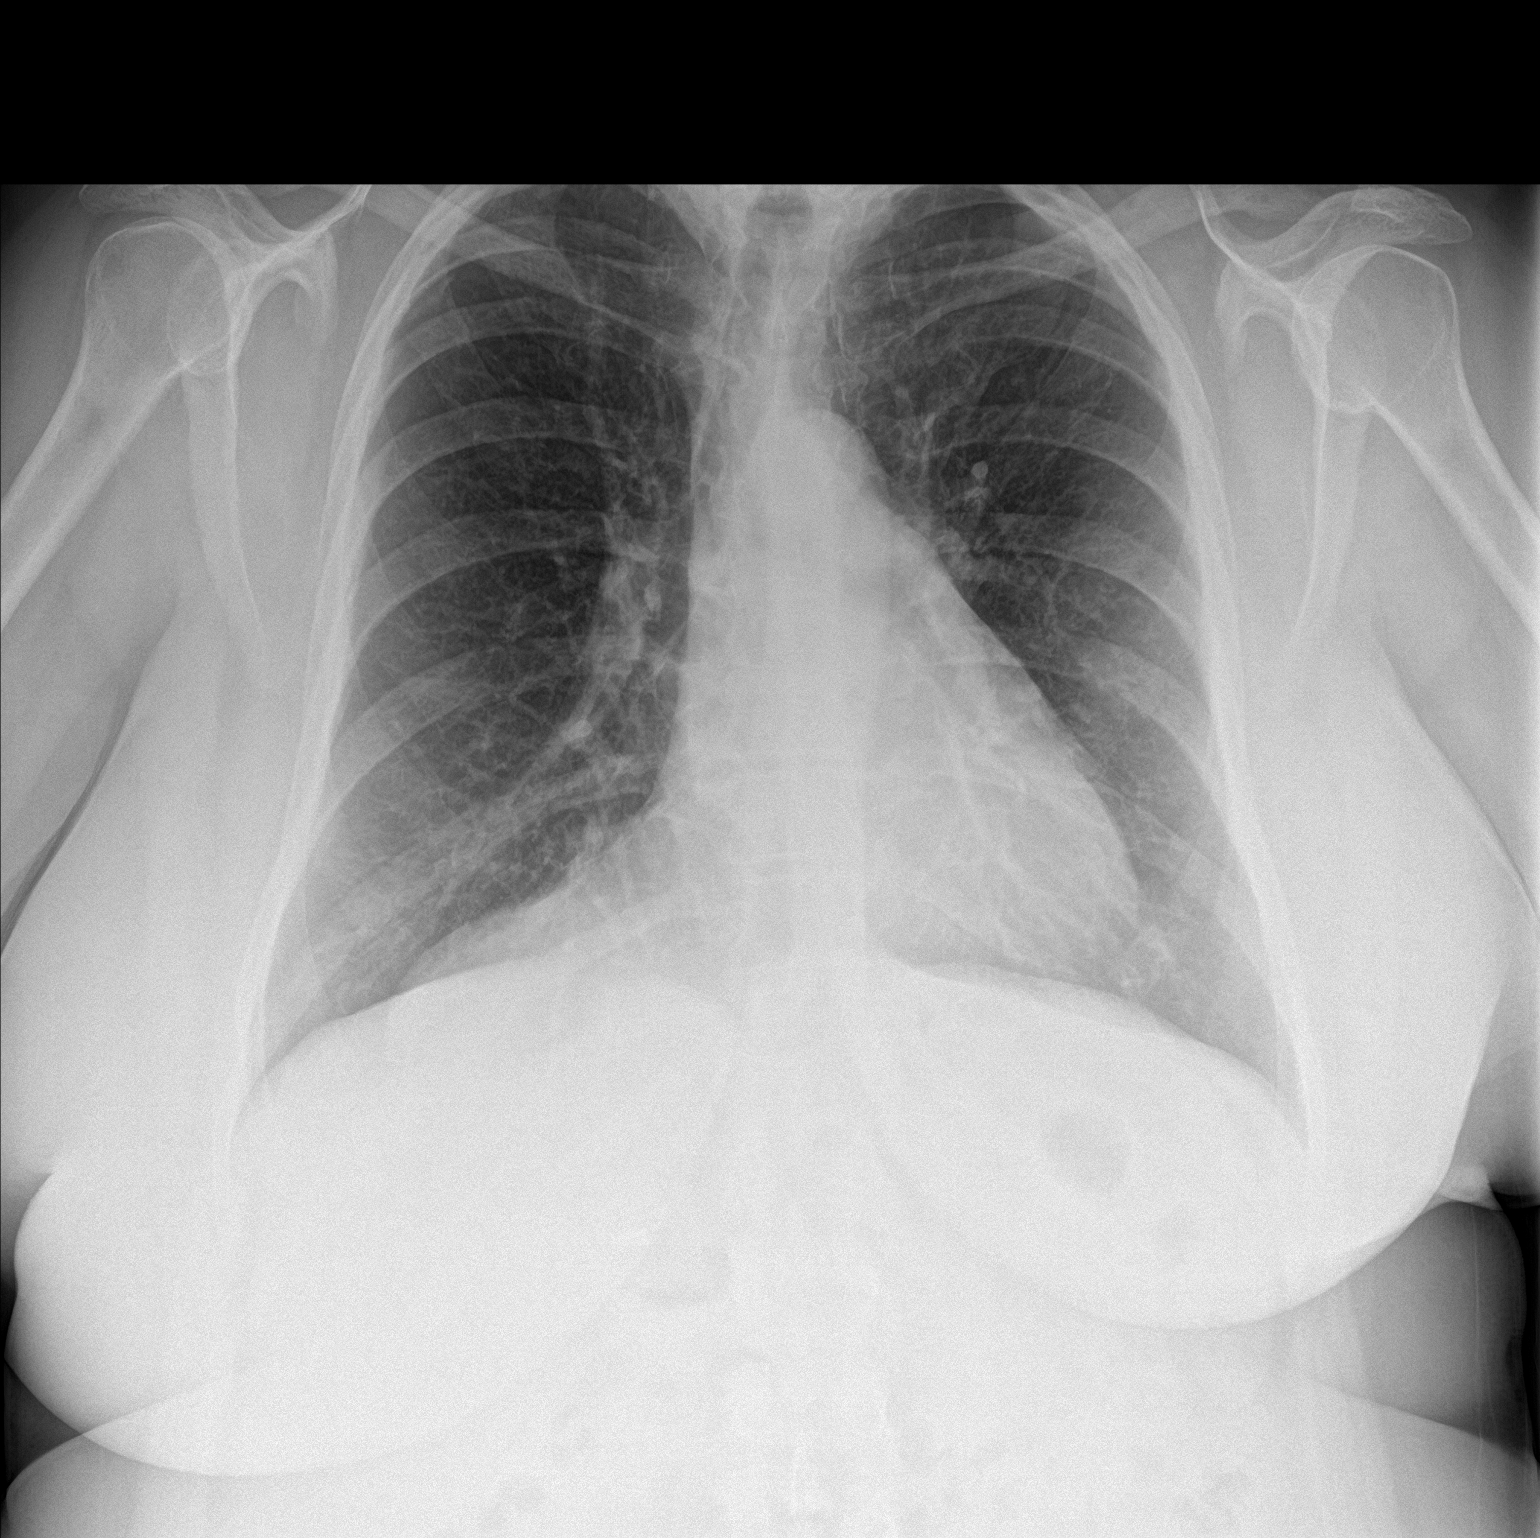

[chest lat]
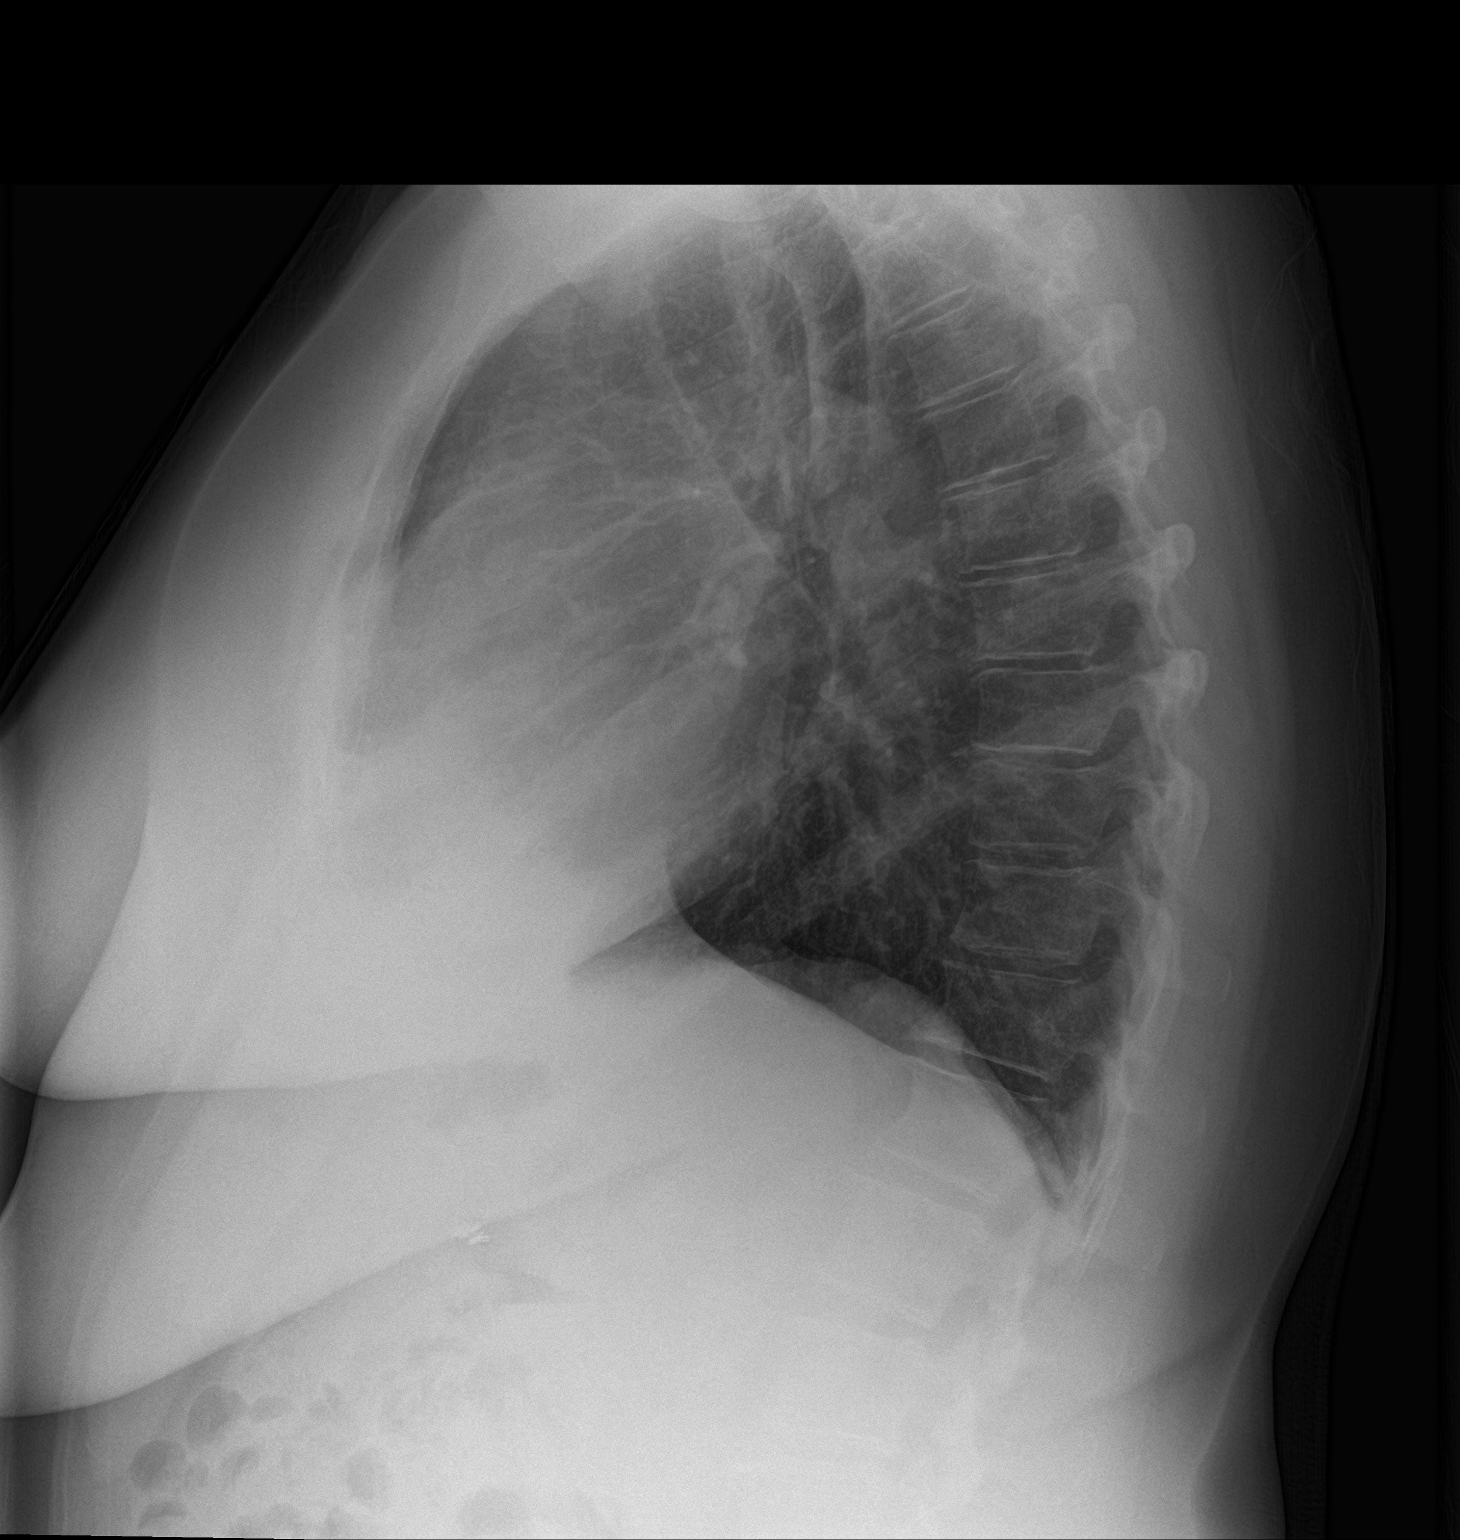

[2 of 2 positions shown; findings below may reference images not displayed]

FINDINGS: The lungs are well-expanded. There is no focal infiltrate. The
interstitial markings are mildly prominent. The heart and pulmonary
vascularity are normal. The mediastinum is normal in width. The bony
thorax is unremarkable.
IMPRESSION: Mild chronic bronchitic -smoking related changes. No pneumonia, CHF,
nor other acute cardiopulmonary abnormality.

## 2018-05-28 DIAGNOSIS — M159 Polyosteoarthritis, unspecified: Secondary | ICD-10-CM | POA: Diagnosis not present

## 2018-05-28 DIAGNOSIS — E7800 Pure hypercholesterolemia, unspecified: Secondary | ICD-10-CM | POA: Diagnosis not present

## 2018-05-28 DIAGNOSIS — I5032 Chronic diastolic (congestive) heart failure: Secondary | ICD-10-CM | POA: Diagnosis not present

## 2018-05-28 DIAGNOSIS — E1129 Type 2 diabetes mellitus with other diabetic kidney complication: Secondary | ICD-10-CM | POA: Diagnosis not present

## 2018-05-28 DIAGNOSIS — R809 Proteinuria, unspecified: Secondary | ICD-10-CM | POA: Diagnosis not present

## 2018-05-28 DIAGNOSIS — E785 Hyperlipidemia, unspecified: Secondary | ICD-10-CM | POA: Diagnosis not present

## 2018-05-28 DIAGNOSIS — I110 Hypertensive heart disease with heart failure: Secondary | ICD-10-CM | POA: Diagnosis not present

## 2018-05-28 DIAGNOSIS — E559 Vitamin D deficiency, unspecified: Secondary | ICD-10-CM | POA: Diagnosis not present

## 2018-05-28 DIAGNOSIS — E1169 Type 2 diabetes mellitus with other specified complication: Secondary | ICD-10-CM | POA: Diagnosis not present

## 2018-06-21 DIAGNOSIS — Z86000 Personal history of in-situ neoplasm of breast: Secondary | ICD-10-CM | POA: Diagnosis not present

## 2018-07-03 DIAGNOSIS — Z6831 Body mass index (BMI) 31.0-31.9, adult: Secondary | ICD-10-CM | POA: Diagnosis not present

## 2018-07-03 DIAGNOSIS — R112 Nausea with vomiting, unspecified: Secondary | ICD-10-CM | POA: Diagnosis not present

## 2018-07-03 DIAGNOSIS — K2970 Gastritis, unspecified, without bleeding: Secondary | ICD-10-CM | POA: Diagnosis not present

## 2018-08-01 DIAGNOSIS — I160 Hypertensive urgency: Secondary | ICD-10-CM | POA: Diagnosis not present

## 2018-08-15 DIAGNOSIS — K219 Gastro-esophageal reflux disease without esophagitis: Secondary | ICD-10-CM | POA: Diagnosis not present

## 2018-08-15 DIAGNOSIS — K2970 Gastritis, unspecified, without bleeding: Secondary | ICD-10-CM | POA: Diagnosis not present

## 2018-08-15 DIAGNOSIS — Z6830 Body mass index (BMI) 30.0-30.9, adult: Secondary | ICD-10-CM | POA: Diagnosis not present

## 2018-08-15 DIAGNOSIS — I110 Hypertensive heart disease with heart failure: Secondary | ICD-10-CM | POA: Diagnosis not present

## 2018-08-15 DIAGNOSIS — R112 Nausea with vomiting, unspecified: Secondary | ICD-10-CM | POA: Diagnosis not present

## 2018-08-15 DIAGNOSIS — I5032 Chronic diastolic (congestive) heart failure: Secondary | ICD-10-CM | POA: Diagnosis not present

## 2018-09-20 DIAGNOSIS — Z23 Encounter for immunization: Secondary | ICD-10-CM | POA: Diagnosis not present

## 2018-12-30 DIAGNOSIS — R300 Dysuria: Secondary | ICD-10-CM | POA: Diagnosis not present

## 2018-12-30 DIAGNOSIS — K5732 Diverticulitis of large intestine without perforation or abscess without bleeding: Secondary | ICD-10-CM | POA: Diagnosis not present

## 2018-12-30 DIAGNOSIS — E663 Overweight: Secondary | ICD-10-CM | POA: Diagnosis not present

## 2018-12-30 DIAGNOSIS — K219 Gastro-esophageal reflux disease without esophagitis: Secondary | ICD-10-CM | POA: Diagnosis not present

## 2018-12-30 DIAGNOSIS — Z6829 Body mass index (BMI) 29.0-29.9, adult: Secondary | ICD-10-CM | POA: Diagnosis not present

## 2018-12-31 DIAGNOSIS — I10 Essential (primary) hypertension: Secondary | ICD-10-CM | POA: Diagnosis not present

## 2018-12-31 DIAGNOSIS — J449 Chronic obstructive pulmonary disease, unspecified: Secondary | ICD-10-CM | POA: Diagnosis not present

## 2018-12-31 DIAGNOSIS — R300 Dysuria: Secondary | ICD-10-CM | POA: Diagnosis not present

## 2018-12-31 DIAGNOSIS — R112 Nausea with vomiting, unspecified: Secondary | ICD-10-CM | POA: Diagnosis not present

## 2019-02-04 DIAGNOSIS — R809 Proteinuria, unspecified: Secondary | ICD-10-CM | POA: Diagnosis not present

## 2019-02-04 DIAGNOSIS — E1129 Type 2 diabetes mellitus with other diabetic kidney complication: Secondary | ICD-10-CM | POA: Diagnosis not present

## 2019-02-04 DIAGNOSIS — E1169 Type 2 diabetes mellitus with other specified complication: Secondary | ICD-10-CM | POA: Diagnosis not present

## 2019-02-04 DIAGNOSIS — M159 Polyosteoarthritis, unspecified: Secondary | ICD-10-CM | POA: Diagnosis not present

## 2019-02-04 DIAGNOSIS — I5032 Chronic diastolic (congestive) heart failure: Secondary | ICD-10-CM | POA: Diagnosis not present

## 2019-02-04 DIAGNOSIS — E785 Hyperlipidemia, unspecified: Secondary | ICD-10-CM | POA: Diagnosis not present

## 2019-02-04 DIAGNOSIS — I110 Hypertensive heart disease with heart failure: Secondary | ICD-10-CM | POA: Diagnosis not present

## 2019-02-04 DIAGNOSIS — E7800 Pure hypercholesterolemia, unspecified: Secondary | ICD-10-CM | POA: Diagnosis not present

## 2019-02-04 DIAGNOSIS — K219 Gastro-esophageal reflux disease without esophagitis: Secondary | ICD-10-CM | POA: Diagnosis not present

## 2019-02-05 DIAGNOSIS — E1129 Type 2 diabetes mellitus with other diabetic kidney complication: Secondary | ICD-10-CM | POA: Diagnosis not present

## 2019-02-05 DIAGNOSIS — E785 Hyperlipidemia, unspecified: Secondary | ICD-10-CM | POA: Diagnosis not present

## 2019-03-10 DIAGNOSIS — R55 Syncope and collapse: Secondary | ICD-10-CM | POA: Diagnosis not present

## 2019-03-10 DIAGNOSIS — R42 Dizziness and giddiness: Secondary | ICD-10-CM | POA: Diagnosis not present

## 2019-03-10 DIAGNOSIS — E663 Overweight: Secondary | ICD-10-CM | POA: Diagnosis not present

## 2019-03-10 DIAGNOSIS — Z6828 Body mass index (BMI) 28.0-28.9, adult: Secondary | ICD-10-CM | POA: Diagnosis not present

## 2019-03-10 DIAGNOSIS — I10 Essential (primary) hypertension: Secondary | ICD-10-CM | POA: Diagnosis not present

## 2019-03-10 DIAGNOSIS — I5032 Chronic diastolic (congestive) heart failure: Secondary | ICD-10-CM | POA: Diagnosis not present

## 2019-05-16 DIAGNOSIS — R109 Unspecified abdominal pain: Secondary | ICD-10-CM | POA: Diagnosis not present

## 2019-05-16 DIAGNOSIS — E860 Dehydration: Secondary | ICD-10-CM | POA: Diagnosis not present

## 2019-05-16 DIAGNOSIS — R1013 Epigastric pain: Secondary | ICD-10-CM | POA: Diagnosis not present

## 2019-05-16 DIAGNOSIS — K6389 Other specified diseases of intestine: Secondary | ICD-10-CM | POA: Diagnosis not present

## 2019-05-16 DIAGNOSIS — R112 Nausea with vomiting, unspecified: Secondary | ICD-10-CM | POA: Diagnosis not present

## 2019-05-28 DIAGNOSIS — J329 Chronic sinusitis, unspecified: Secondary | ICD-10-CM | POA: Diagnosis not present

## 2019-05-28 DIAGNOSIS — J40 Bronchitis, not specified as acute or chronic: Secondary | ICD-10-CM | POA: Diagnosis not present

## 2019-07-17 DIAGNOSIS — R509 Fever, unspecified: Secondary | ICD-10-CM | POA: Diagnosis not present

## 2019-07-17 DIAGNOSIS — J989 Respiratory disorder, unspecified: Secondary | ICD-10-CM | POA: Diagnosis not present

## 2019-07-17 DIAGNOSIS — Z9181 History of falling: Secondary | ICD-10-CM | POA: Diagnosis not present

## 2019-07-23 DIAGNOSIS — M546 Pain in thoracic spine: Secondary | ICD-10-CM | POA: Diagnosis not present

## 2019-07-26 DIAGNOSIS — Z1231 Encounter for screening mammogram for malignant neoplasm of breast: Secondary | ICD-10-CM | POA: Diagnosis not present

## 2019-08-14 DIAGNOSIS — M8580 Other specified disorders of bone density and structure, unspecified site: Secondary | ICD-10-CM | POA: Diagnosis not present

## 2019-08-14 DIAGNOSIS — Z0000 Encounter for general adult medical examination without abnormal findings: Secondary | ICD-10-CM | POA: Diagnosis not present

## 2019-08-14 DIAGNOSIS — I5032 Chronic diastolic (congestive) heart failure: Secondary | ICD-10-CM | POA: Diagnosis not present

## 2019-08-14 DIAGNOSIS — F3341 Major depressive disorder, recurrent, in partial remission: Secondary | ICD-10-CM | POA: Diagnosis not present

## 2019-08-14 DIAGNOSIS — E785 Hyperlipidemia, unspecified: Secondary | ICD-10-CM | POA: Diagnosis not present

## 2019-08-14 DIAGNOSIS — E7800 Pure hypercholesterolemia, unspecified: Secondary | ICD-10-CM | POA: Diagnosis not present

## 2019-08-14 DIAGNOSIS — Z79899 Other long term (current) drug therapy: Secondary | ICD-10-CM | POA: Diagnosis not present

## 2019-08-14 DIAGNOSIS — E1169 Type 2 diabetes mellitus with other specified complication: Secondary | ICD-10-CM | POA: Diagnosis not present

## 2019-08-14 DIAGNOSIS — J449 Chronic obstructive pulmonary disease, unspecified: Secondary | ICD-10-CM | POA: Diagnosis not present

## 2019-08-14 DIAGNOSIS — E1129 Type 2 diabetes mellitus with other diabetic kidney complication: Secondary | ICD-10-CM | POA: Diagnosis not present

## 2019-08-14 DIAGNOSIS — Z23 Encounter for immunization: Secondary | ICD-10-CM | POA: Diagnosis not present

## 2019-08-14 DIAGNOSIS — R809 Proteinuria, unspecified: Secondary | ICD-10-CM | POA: Diagnosis not present

## 2019-08-14 DIAGNOSIS — I110 Hypertensive heart disease with heart failure: Secondary | ICD-10-CM | POA: Diagnosis not present

## 2019-11-18 DIAGNOSIS — I5032 Chronic diastolic (congestive) heart failure: Secondary | ICD-10-CM | POA: Diagnosis not present

## 2019-11-18 DIAGNOSIS — M159 Polyosteoarthritis, unspecified: Secondary | ICD-10-CM | POA: Diagnosis not present

## 2019-11-18 DIAGNOSIS — E7800 Pure hypercholesterolemia, unspecified: Secondary | ICD-10-CM | POA: Diagnosis not present

## 2019-11-18 DIAGNOSIS — R809 Proteinuria, unspecified: Secondary | ICD-10-CM | POA: Diagnosis not present

## 2019-11-18 DIAGNOSIS — Z79899 Other long term (current) drug therapy: Secondary | ICD-10-CM | POA: Diagnosis not present

## 2019-11-18 DIAGNOSIS — E1129 Type 2 diabetes mellitus with other diabetic kidney complication: Secondary | ICD-10-CM | POA: Diagnosis not present

## 2019-11-18 DIAGNOSIS — E785 Hyperlipidemia, unspecified: Secondary | ICD-10-CM | POA: Diagnosis not present

## 2019-11-18 DIAGNOSIS — I110 Hypertensive heart disease with heart failure: Secondary | ICD-10-CM | POA: Diagnosis not present

## 2019-11-18 DIAGNOSIS — E1169 Type 2 diabetes mellitus with other specified complication: Secondary | ICD-10-CM | POA: Diagnosis not present

## 2019-12-09 DIAGNOSIS — M791 Myalgia, unspecified site: Secondary | ICD-10-CM | POA: Diagnosis not present

## 2019-12-09 DIAGNOSIS — Z20828 Contact with and (suspected) exposure to other viral communicable diseases: Secondary | ICD-10-CM | POA: Diagnosis not present

## 2019-12-09 DIAGNOSIS — R05 Cough: Secondary | ICD-10-CM | POA: Diagnosis not present

## 2019-12-09 DIAGNOSIS — J209 Acute bronchitis, unspecified: Secondary | ICD-10-CM | POA: Diagnosis not present

## 2019-12-09 DIAGNOSIS — R071 Chest pain on breathing: Secondary | ICD-10-CM | POA: Diagnosis not present

## 2019-12-09 DIAGNOSIS — R0602 Shortness of breath: Secondary | ICD-10-CM | POA: Diagnosis not present

## 2020-01-01 DIAGNOSIS — R05 Cough: Secondary | ICD-10-CM | POA: Diagnosis not present

## 2020-01-01 DIAGNOSIS — R0600 Dyspnea, unspecified: Secondary | ICD-10-CM | POA: Diagnosis not present

## 2020-01-19 DIAGNOSIS — M159 Polyosteoarthritis, unspecified: Secondary | ICD-10-CM | POA: Diagnosis not present

## 2020-01-19 DIAGNOSIS — E7800 Pure hypercholesterolemia, unspecified: Secondary | ICD-10-CM | POA: Diagnosis not present

## 2020-01-19 DIAGNOSIS — E1129 Type 2 diabetes mellitus with other diabetic kidney complication: Secondary | ICD-10-CM | POA: Diagnosis not present

## 2020-01-19 DIAGNOSIS — R809 Proteinuria, unspecified: Secondary | ICD-10-CM | POA: Diagnosis not present

## 2020-01-19 DIAGNOSIS — I110 Hypertensive heart disease with heart failure: Secondary | ICD-10-CM | POA: Diagnosis not present

## 2020-01-19 DIAGNOSIS — E785 Hyperlipidemia, unspecified: Secondary | ICD-10-CM | POA: Diagnosis not present

## 2020-01-19 DIAGNOSIS — F3341 Major depressive disorder, recurrent, in partial remission: Secondary | ICD-10-CM | POA: Diagnosis not present

## 2020-01-19 DIAGNOSIS — J449 Chronic obstructive pulmonary disease, unspecified: Secondary | ICD-10-CM | POA: Diagnosis not present

## 2020-01-19 DIAGNOSIS — I5032 Chronic diastolic (congestive) heart failure: Secondary | ICD-10-CM | POA: Diagnosis not present

## 2020-01-19 DIAGNOSIS — N183 Chronic kidney disease, stage 3 unspecified: Secondary | ICD-10-CM | POA: Diagnosis not present

## 2020-01-19 DIAGNOSIS — E1169 Type 2 diabetes mellitus with other specified complication: Secondary | ICD-10-CM | POA: Diagnosis not present

## 2020-03-11 DIAGNOSIS — H353131 Nonexudative age-related macular degeneration, bilateral, early dry stage: Secondary | ICD-10-CM | POA: Diagnosis not present

## 2020-03-11 DIAGNOSIS — E119 Type 2 diabetes mellitus without complications: Secondary | ICD-10-CM | POA: Diagnosis not present

## 2020-03-11 DIAGNOSIS — Z0100 Encounter for examination of eyes and vision without abnormal findings: Secondary | ICD-10-CM | POA: Diagnosis not present

## 2020-05-04 DIAGNOSIS — N183 Chronic kidney disease, stage 3 unspecified: Secondary | ICD-10-CM | POA: Diagnosis not present

## 2020-05-04 DIAGNOSIS — I110 Hypertensive heart disease with heart failure: Secondary | ICD-10-CM | POA: Diagnosis not present

## 2020-05-04 DIAGNOSIS — I5032 Chronic diastolic (congestive) heart failure: Secondary | ICD-10-CM | POA: Diagnosis not present

## 2020-05-04 DIAGNOSIS — Z6830 Body mass index (BMI) 30.0-30.9, adult: Secondary | ICD-10-CM | POA: Diagnosis not present

## 2020-05-21 DIAGNOSIS — R809 Proteinuria, unspecified: Secondary | ICD-10-CM | POA: Diagnosis not present

## 2020-05-21 DIAGNOSIS — M8580 Other specified disorders of bone density and structure, unspecified site: Secondary | ICD-10-CM | POA: Diagnosis not present

## 2020-05-21 DIAGNOSIS — E785 Hyperlipidemia, unspecified: Secondary | ICD-10-CM | POA: Diagnosis not present

## 2020-05-21 DIAGNOSIS — E1129 Type 2 diabetes mellitus with other diabetic kidney complication: Secondary | ICD-10-CM | POA: Diagnosis not present

## 2020-05-21 DIAGNOSIS — Z79899 Other long term (current) drug therapy: Secondary | ICD-10-CM | POA: Diagnosis not present

## 2020-05-21 DIAGNOSIS — N183 Chronic kidney disease, stage 3 unspecified: Secondary | ICD-10-CM | POA: Diagnosis not present

## 2020-05-26 DIAGNOSIS — K219 Gastro-esophageal reflux disease without esophagitis: Secondary | ICD-10-CM | POA: Diagnosis not present

## 2020-05-26 DIAGNOSIS — J449 Chronic obstructive pulmonary disease, unspecified: Secondary | ICD-10-CM | POA: Diagnosis not present

## 2020-06-04 DIAGNOSIS — E1129 Type 2 diabetes mellitus with other diabetic kidney complication: Secondary | ICD-10-CM | POA: Diagnosis not present

## 2020-06-04 DIAGNOSIS — R809 Proteinuria, unspecified: Secondary | ICD-10-CM | POA: Diagnosis not present

## 2020-06-08 DIAGNOSIS — G4733 Obstructive sleep apnea (adult) (pediatric): Secondary | ICD-10-CM | POA: Diagnosis not present

## 2020-06-08 DIAGNOSIS — R0602 Shortness of breath: Secondary | ICD-10-CM | POA: Diagnosis not present

## 2020-06-09 DIAGNOSIS — G4733 Obstructive sleep apnea (adult) (pediatric): Secondary | ICD-10-CM | POA: Diagnosis not present

## 2020-06-09 DIAGNOSIS — R0602 Shortness of breath: Secondary | ICD-10-CM | POA: Diagnosis not present

## 2020-07-27 DIAGNOSIS — G4733 Obstructive sleep apnea (adult) (pediatric): Secondary | ICD-10-CM | POA: Diagnosis not present

## 2020-07-27 DIAGNOSIS — R400 Somnolence: Secondary | ICD-10-CM | POA: Diagnosis not present

## 2020-08-26 DIAGNOSIS — N3090 Cystitis, unspecified without hematuria: Secondary | ICD-10-CM | POA: Diagnosis not present

## 2020-08-26 DIAGNOSIS — G4733 Obstructive sleep apnea (adult) (pediatric): Secondary | ICD-10-CM | POA: Diagnosis not present

## 2020-08-26 DIAGNOSIS — N3000 Acute cystitis without hematuria: Secondary | ICD-10-CM | POA: Diagnosis not present

## 2020-08-26 DIAGNOSIS — R400 Somnolence: Secondary | ICD-10-CM | POA: Diagnosis not present

## 2020-08-26 DIAGNOSIS — R300 Dysuria: Secondary | ICD-10-CM | POA: Diagnosis not present

## 2020-10-26 DIAGNOSIS — I130 Hypertensive heart and chronic kidney disease with heart failure and stage 1 through stage 4 chronic kidney disease, or unspecified chronic kidney disease: Secondary | ICD-10-CM | POA: Diagnosis not present

## 2020-10-26 DIAGNOSIS — N183 Chronic kidney disease, stage 3 unspecified: Secondary | ICD-10-CM | POA: Diagnosis not present

## 2020-10-26 DIAGNOSIS — E1122 Type 2 diabetes mellitus with diabetic chronic kidney disease: Secondary | ICD-10-CM | POA: Diagnosis not present

## 2020-10-26 DIAGNOSIS — I5032 Chronic diastolic (congestive) heart failure: Secondary | ICD-10-CM | POA: Diagnosis not present

## 2020-11-08 DIAGNOSIS — E785 Hyperlipidemia, unspecified: Secondary | ICD-10-CM | POA: Diagnosis not present

## 2020-11-08 DIAGNOSIS — N183 Chronic kidney disease, stage 3 unspecified: Secondary | ICD-10-CM | POA: Diagnosis not present

## 2020-11-08 DIAGNOSIS — E1129 Type 2 diabetes mellitus with other diabetic kidney complication: Secondary | ICD-10-CM | POA: Diagnosis not present

## 2020-11-08 DIAGNOSIS — D519 Vitamin B12 deficiency anemia, unspecified: Secondary | ICD-10-CM | POA: Diagnosis not present

## 2020-11-08 DIAGNOSIS — E559 Vitamin D deficiency, unspecified: Secondary | ICD-10-CM | POA: Diagnosis not present

## 2020-11-08 DIAGNOSIS — Z79899 Other long term (current) drug therapy: Secondary | ICD-10-CM | POA: Diagnosis not present

## 2020-11-25 DIAGNOSIS — E1169 Type 2 diabetes mellitus with other specified complication: Secondary | ICD-10-CM | POA: Diagnosis not present

## 2020-11-25 DIAGNOSIS — K219 Gastro-esophageal reflux disease without esophagitis: Secondary | ICD-10-CM | POA: Diagnosis not present

## 2020-11-25 DIAGNOSIS — E785 Hyperlipidemia, unspecified: Secondary | ICD-10-CM | POA: Diagnosis not present

## 2020-12-20 DIAGNOSIS — E1129 Type 2 diabetes mellitus with other diabetic kidney complication: Secondary | ICD-10-CM | POA: Diagnosis not present

## 2020-12-26 DIAGNOSIS — F329 Major depressive disorder, single episode, unspecified: Secondary | ICD-10-CM | POA: Diagnosis not present

## 2020-12-26 DIAGNOSIS — M4800 Spinal stenosis, site unspecified: Secondary | ICD-10-CM | POA: Diagnosis not present

## 2020-12-26 DIAGNOSIS — I10 Essential (primary) hypertension: Secondary | ICD-10-CM | POA: Diagnosis not present

## 2021-01-05 DIAGNOSIS — R809 Proteinuria, unspecified: Secondary | ICD-10-CM | POA: Diagnosis not present

## 2021-01-05 DIAGNOSIS — N1831 Chronic kidney disease, stage 3a: Secondary | ICD-10-CM | POA: Diagnosis not present

## 2021-01-05 DIAGNOSIS — I110 Hypertensive heart disease with heart failure: Secondary | ICD-10-CM | POA: Diagnosis not present

## 2021-01-05 DIAGNOSIS — E1129 Type 2 diabetes mellitus with other diabetic kidney complication: Secondary | ICD-10-CM | POA: Diagnosis not present

## 2021-01-05 DIAGNOSIS — E785 Hyperlipidemia, unspecified: Secondary | ICD-10-CM | POA: Diagnosis not present

## 2021-01-05 DIAGNOSIS — E1169 Type 2 diabetes mellitus with other specified complication: Secondary | ICD-10-CM | POA: Diagnosis not present

## 2021-01-05 DIAGNOSIS — M8580 Other specified disorders of bone density and structure, unspecified site: Secondary | ICD-10-CM | POA: Diagnosis not present

## 2021-01-05 DIAGNOSIS — E7800 Pure hypercholesterolemia, unspecified: Secondary | ICD-10-CM | POA: Diagnosis not present

## 2021-01-05 DIAGNOSIS — Z0000 Encounter for general adult medical examination without abnormal findings: Secondary | ICD-10-CM | POA: Diagnosis not present

## 2021-01-24 DIAGNOSIS — K219 Gastro-esophageal reflux disease without esophagitis: Secondary | ICD-10-CM | POA: Diagnosis not present

## 2021-01-24 DIAGNOSIS — J449 Chronic obstructive pulmonary disease, unspecified: Secondary | ICD-10-CM | POA: Diagnosis not present

## 2021-03-26 DIAGNOSIS — K219 Gastro-esophageal reflux disease without esophagitis: Secondary | ICD-10-CM | POA: Diagnosis not present

## 2021-03-26 DIAGNOSIS — J449 Chronic obstructive pulmonary disease, unspecified: Secondary | ICD-10-CM | POA: Diagnosis not present

## 2021-07-13 DIAGNOSIS — M1991 Primary osteoarthritis, unspecified site: Secondary | ICD-10-CM | POA: Diagnosis not present

## 2021-07-13 DIAGNOSIS — Z6829 Body mass index (BMI) 29.0-29.9, adult: Secondary | ICD-10-CM | POA: Diagnosis not present

## 2021-07-13 DIAGNOSIS — I5032 Chronic diastolic (congestive) heart failure: Secondary | ICD-10-CM | POA: Diagnosis not present

## 2021-07-13 DIAGNOSIS — M62830 Muscle spasm of back: Secondary | ICD-10-CM | POA: Diagnosis not present

## 2021-07-13 DIAGNOSIS — S39012A Strain of muscle, fascia and tendon of lower back, initial encounter: Secondary | ICD-10-CM | POA: Diagnosis not present

## 2021-07-13 DIAGNOSIS — R809 Proteinuria, unspecified: Secondary | ICD-10-CM | POA: Diagnosis not present

## 2021-07-13 DIAGNOSIS — I110 Hypertensive heart disease with heart failure: Secondary | ICD-10-CM | POA: Diagnosis not present

## 2021-07-13 DIAGNOSIS — M47819 Spondylosis without myelopathy or radiculopathy, site unspecified: Secondary | ICD-10-CM | POA: Diagnosis not present

## 2021-07-13 DIAGNOSIS — E1129 Type 2 diabetes mellitus with other diabetic kidney complication: Secondary | ICD-10-CM | POA: Diagnosis not present

## 2021-08-03 DIAGNOSIS — M62830 Muscle spasm of back: Secondary | ICD-10-CM | POA: Diagnosis not present

## 2021-08-03 DIAGNOSIS — N1831 Chronic kidney disease, stage 3a: Secondary | ICD-10-CM | POA: Diagnosis not present

## 2021-08-03 DIAGNOSIS — M47819 Spondylosis without myelopathy or radiculopathy, site unspecified: Secondary | ICD-10-CM | POA: Diagnosis not present

## 2021-08-03 DIAGNOSIS — S39012D Strain of muscle, fascia and tendon of lower back, subsequent encounter: Secondary | ICD-10-CM | POA: Diagnosis not present

## 2021-08-03 DIAGNOSIS — Z6828 Body mass index (BMI) 28.0-28.9, adult: Secondary | ICD-10-CM | POA: Diagnosis not present

## 2021-08-03 DIAGNOSIS — R21 Rash and other nonspecific skin eruption: Secondary | ICD-10-CM | POA: Diagnosis not present

## 2021-08-03 DIAGNOSIS — M1991 Primary osteoarthritis, unspecified site: Secondary | ICD-10-CM | POA: Diagnosis not present

## 2021-10-04 DIAGNOSIS — B86 Scabies: Secondary | ICD-10-CM | POA: Diagnosis not present

## 2021-10-04 DIAGNOSIS — R21 Rash and other nonspecific skin eruption: Secondary | ICD-10-CM | POA: Diagnosis not present

## 2021-11-25 DIAGNOSIS — Z6829 Body mass index (BMI) 29.0-29.9, adult: Secondary | ICD-10-CM | POA: Diagnosis not present

## 2021-11-25 DIAGNOSIS — R059 Cough, unspecified: Secondary | ICD-10-CM | POA: Diagnosis not present

## 2021-11-25 DIAGNOSIS — R079 Chest pain, unspecified: Secondary | ICD-10-CM | POA: Diagnosis not present

## 2021-11-25 DIAGNOSIS — R0600 Dyspnea, unspecified: Secondary | ICD-10-CM | POA: Diagnosis not present

## 2021-11-25 DIAGNOSIS — R031 Nonspecific low blood-pressure reading: Secondary | ICD-10-CM | POA: Diagnosis not present

## 2021-11-25 DIAGNOSIS — J40 Bronchitis, not specified as acute or chronic: Secondary | ICD-10-CM | POA: Diagnosis not present

## 2021-11-29 DIAGNOSIS — D649 Anemia, unspecified: Secondary | ICD-10-CM | POA: Diagnosis not present

## 2021-12-01 DIAGNOSIS — I5032 Chronic diastolic (congestive) heart failure: Secondary | ICD-10-CM | POA: Diagnosis not present

## 2021-12-01 DIAGNOSIS — N1831 Chronic kidney disease, stage 3a: Secondary | ICD-10-CM | POA: Diagnosis not present

## 2021-12-01 DIAGNOSIS — E663 Overweight: Secondary | ICD-10-CM | POA: Diagnosis not present

## 2021-12-01 DIAGNOSIS — Z6829 Body mass index (BMI) 29.0-29.9, adult: Secondary | ICD-10-CM | POA: Diagnosis not present

## 2021-12-01 DIAGNOSIS — I110 Hypertensive heart disease with heart failure: Secondary | ICD-10-CM | POA: Diagnosis not present

## 2021-12-01 DIAGNOSIS — D509 Iron deficiency anemia, unspecified: Secondary | ICD-10-CM | POA: Diagnosis not present

## 2021-12-01 DIAGNOSIS — J418 Mixed simple and mucopurulent chronic bronchitis: Secondary | ICD-10-CM | POA: Diagnosis not present

## 2021-12-27 DIAGNOSIS — Z0100 Encounter for examination of eyes and vision without abnormal findings: Secondary | ICD-10-CM | POA: Diagnosis not present

## 2021-12-27 DIAGNOSIS — E119 Type 2 diabetes mellitus without complications: Secondary | ICD-10-CM | POA: Diagnosis not present

## 2021-12-27 DIAGNOSIS — H25813 Combined forms of age-related cataract, bilateral: Secondary | ICD-10-CM | POA: Diagnosis not present

## 2021-12-27 DIAGNOSIS — H353131 Nonexudative age-related macular degeneration, bilateral, early dry stage: Secondary | ICD-10-CM | POA: Diagnosis not present

## 2022-02-07 DIAGNOSIS — J209 Acute bronchitis, unspecified: Secondary | ICD-10-CM | POA: Diagnosis not present

## 2022-02-07 DIAGNOSIS — R051 Acute cough: Secondary | ICD-10-CM | POA: Diagnosis not present

## 2022-02-07 DIAGNOSIS — R0981 Nasal congestion: Secondary | ICD-10-CM | POA: Diagnosis not present

## 2022-03-10 DIAGNOSIS — M5430 Sciatica, unspecified side: Secondary | ICD-10-CM | POA: Diagnosis not present

## 2022-03-20 DIAGNOSIS — R739 Hyperglycemia, unspecified: Secondary | ICD-10-CM | POA: Diagnosis not present

## 2022-03-20 DIAGNOSIS — Z0100 Encounter for examination of eyes and vision without abnormal findings: Secondary | ICD-10-CM | POA: Diagnosis not present

## 2022-04-09 DIAGNOSIS — I10 Essential (primary) hypertension: Secondary | ICD-10-CM | POA: Diagnosis not present

## 2022-04-09 DIAGNOSIS — R0789 Other chest pain: Secondary | ICD-10-CM | POA: Diagnosis not present

## 2022-04-09 DIAGNOSIS — R079 Chest pain, unspecified: Secondary | ICD-10-CM | POA: Diagnosis not present

## 2022-04-09 DIAGNOSIS — R059 Cough, unspecified: Secondary | ICD-10-CM | POA: Diagnosis not present

## 2022-04-09 DIAGNOSIS — R42 Dizziness and giddiness: Secondary | ICD-10-CM | POA: Diagnosis not present

## 2022-04-09 DIAGNOSIS — J441 Chronic obstructive pulmonary disease with (acute) exacerbation: Secondary | ICD-10-CM | POA: Diagnosis not present

## 2022-04-09 DIAGNOSIS — Z87891 Personal history of nicotine dependence: Secondary | ICD-10-CM | POA: Diagnosis not present

## 2022-04-09 DIAGNOSIS — K219 Gastro-esophageal reflux disease without esophagitis: Secondary | ICD-10-CM | POA: Diagnosis not present

## 2022-04-10 DIAGNOSIS — R079 Chest pain, unspecified: Secondary | ICD-10-CM | POA: Diagnosis not present

## 2022-04-10 DIAGNOSIS — R059 Cough, unspecified: Secondary | ICD-10-CM | POA: Diagnosis not present

## 2022-04-10 DIAGNOSIS — R42 Dizziness and giddiness: Secondary | ICD-10-CM | POA: Diagnosis not present

## 2022-04-18 DIAGNOSIS — R0602 Shortness of breath: Secondary | ICD-10-CM | POA: Diagnosis not present

## 2022-04-18 DIAGNOSIS — R0600 Dyspnea, unspecified: Secondary | ICD-10-CM | POA: Diagnosis not present

## 2022-04-18 DIAGNOSIS — J449 Chronic obstructive pulmonary disease, unspecified: Secondary | ICD-10-CM | POA: Diagnosis not present

## 2022-04-18 DIAGNOSIS — R051 Acute cough: Secondary | ICD-10-CM | POA: Diagnosis not present

## 2022-04-18 DIAGNOSIS — R300 Dysuria: Secondary | ICD-10-CM | POA: Diagnosis not present

## 2022-04-26 DIAGNOSIS — J441 Chronic obstructive pulmonary disease with (acute) exacerbation: Secondary | ICD-10-CM | POA: Diagnosis not present

## 2022-04-26 DIAGNOSIS — Z6825 Body mass index (BMI) 25.0-25.9, adult: Secondary | ICD-10-CM | POA: Diagnosis not present

## 2022-05-28 DIAGNOSIS — I6381 Other cerebral infarction due to occlusion or stenosis of small artery: Secondary | ICD-10-CM | POA: Diagnosis not present

## 2022-05-28 DIAGNOSIS — G44319 Acute post-traumatic headache, not intractable: Secondary | ICD-10-CM | POA: Diagnosis not present

## 2022-05-28 DIAGNOSIS — R519 Headache, unspecified: Secondary | ICD-10-CM | POA: Diagnosis not present

## 2022-06-06 DIAGNOSIS — Z0100 Encounter for examination of eyes and vision without abnormal findings: Secondary | ICD-10-CM | POA: Diagnosis not present

## 2022-06-06 DIAGNOSIS — R739 Hyperglycemia, unspecified: Secondary | ICD-10-CM | POA: Diagnosis not present

## 2022-06-06 DIAGNOSIS — H353131 Nonexudative age-related macular degeneration, bilateral, early dry stage: Secondary | ICD-10-CM | POA: Diagnosis not present

## 2022-06-06 LAB — HM DIABETES EYE EXAM

## 2022-07-20 DIAGNOSIS — H353132 Nonexudative age-related macular degeneration, bilateral, intermediate dry stage: Secondary | ICD-10-CM | POA: Diagnosis not present

## 2022-07-20 DIAGNOSIS — H2513 Age-related nuclear cataract, bilateral: Secondary | ICD-10-CM | POA: Diagnosis not present

## 2022-07-20 DIAGNOSIS — E119 Type 2 diabetes mellitus without complications: Secondary | ICD-10-CM | POA: Diagnosis not present

## 2022-08-07 DIAGNOSIS — M25512 Pain in left shoulder: Secondary | ICD-10-CM | POA: Diagnosis not present

## 2022-08-31 DIAGNOSIS — D509 Iron deficiency anemia, unspecified: Secondary | ICD-10-CM | POA: Diagnosis not present

## 2022-08-31 DIAGNOSIS — Z23 Encounter for immunization: Secondary | ICD-10-CM | POA: Diagnosis not present

## 2022-08-31 DIAGNOSIS — E7800 Pure hypercholesterolemia, unspecified: Secondary | ICD-10-CM | POA: Diagnosis not present

## 2022-08-31 DIAGNOSIS — E785 Hyperlipidemia, unspecified: Secondary | ICD-10-CM | POA: Diagnosis not present

## 2022-08-31 DIAGNOSIS — Z0000 Encounter for general adult medical examination without abnormal findings: Secondary | ICD-10-CM | POA: Diagnosis not present

## 2022-08-31 DIAGNOSIS — E1169 Type 2 diabetes mellitus with other specified complication: Secondary | ICD-10-CM | POA: Diagnosis not present

## 2022-08-31 DIAGNOSIS — R809 Proteinuria, unspecified: Secondary | ICD-10-CM | POA: Diagnosis not present

## 2022-08-31 DIAGNOSIS — E1129 Type 2 diabetes mellitus with other diabetic kidney complication: Secondary | ICD-10-CM | POA: Diagnosis not present

## 2022-08-31 DIAGNOSIS — M1991 Primary osteoarthritis, unspecified site: Secondary | ICD-10-CM | POA: Diagnosis not present

## 2022-11-28 DIAGNOSIS — H18521 Epithelial (juvenile) corneal dystrophy, right eye: Secondary | ICD-10-CM | POA: Diagnosis not present

## 2022-11-28 DIAGNOSIS — H18451 Nodular corneal degeneration, right eye: Secondary | ICD-10-CM | POA: Diagnosis not present

## 2022-11-28 DIAGNOSIS — H25813 Combined forms of age-related cataract, bilateral: Secondary | ICD-10-CM | POA: Diagnosis not present

## 2022-12-11 DIAGNOSIS — H18591 Other hereditary corneal dystrophies, right eye: Secondary | ICD-10-CM | POA: Diagnosis not present

## 2022-12-11 DIAGNOSIS — H18451 Nodular corneal degeneration, right eye: Secondary | ICD-10-CM | POA: Diagnosis not present

## 2022-12-12 DIAGNOSIS — H18521 Epithelial (juvenile) corneal dystrophy, right eye: Secondary | ICD-10-CM | POA: Diagnosis not present

## 2023-01-30 DIAGNOSIS — H25812 Combined forms of age-related cataract, left eye: Secondary | ICD-10-CM | POA: Diagnosis not present

## 2023-01-30 DIAGNOSIS — E119 Type 2 diabetes mellitus without complications: Secondary | ICD-10-CM | POA: Diagnosis not present

## 2023-01-30 DIAGNOSIS — H25813 Combined forms of age-related cataract, bilateral: Secondary | ICD-10-CM | POA: Diagnosis not present

## 2023-01-30 DIAGNOSIS — Z01818 Encounter for other preprocedural examination: Secondary | ICD-10-CM | POA: Diagnosis not present

## 2023-02-06 DIAGNOSIS — E1136 Type 2 diabetes mellitus with diabetic cataract: Secondary | ICD-10-CM | POA: Diagnosis not present

## 2023-02-06 DIAGNOSIS — H25812 Combined forms of age-related cataract, left eye: Secondary | ICD-10-CM | POA: Diagnosis not present

## 2023-02-06 DIAGNOSIS — H18521 Epithelial (juvenile) corneal dystrophy, right eye: Secondary | ICD-10-CM | POA: Diagnosis not present

## 2023-02-06 DIAGNOSIS — H259 Unspecified age-related cataract: Secondary | ICD-10-CM | POA: Diagnosis not present

## 2023-02-06 DIAGNOSIS — E119 Type 2 diabetes mellitus without complications: Secondary | ICD-10-CM | POA: Diagnosis not present

## 2023-02-06 DIAGNOSIS — Z7984 Long term (current) use of oral hypoglycemic drugs: Secondary | ICD-10-CM | POA: Diagnosis not present

## 2023-02-06 DIAGNOSIS — H52213 Irregular astigmatism, bilateral: Secondary | ICD-10-CM | POA: Diagnosis not present

## 2023-02-06 DIAGNOSIS — Z79899 Other long term (current) drug therapy: Secondary | ICD-10-CM | POA: Diagnosis not present

## 2023-02-20 DIAGNOSIS — E119 Type 2 diabetes mellitus without complications: Secondary | ICD-10-CM | POA: Diagnosis not present

## 2023-02-20 DIAGNOSIS — H353132 Nonexudative age-related macular degeneration, bilateral, intermediate dry stage: Secondary | ICD-10-CM | POA: Diagnosis not present

## 2023-02-20 DIAGNOSIS — E1136 Type 2 diabetes mellitus with diabetic cataract: Secondary | ICD-10-CM | POA: Diagnosis not present

## 2023-02-20 DIAGNOSIS — H25811 Combined forms of age-related cataract, right eye: Secondary | ICD-10-CM | POA: Diagnosis not present

## 2023-02-20 DIAGNOSIS — I10 Essential (primary) hypertension: Secondary | ICD-10-CM | POA: Diagnosis not present

## 2023-02-20 DIAGNOSIS — H259 Unspecified age-related cataract: Secondary | ICD-10-CM | POA: Diagnosis not present

## 2023-02-20 DIAGNOSIS — H52213 Irregular astigmatism, bilateral: Secondary | ICD-10-CM | POA: Diagnosis not present

## 2023-02-20 DIAGNOSIS — Z7984 Long term (current) use of oral hypoglycemic drugs: Secondary | ICD-10-CM | POA: Diagnosis not present

## 2023-04-10 ENCOUNTER — Ambulatory Visit (INDEPENDENT_AMBULATORY_CARE_PROVIDER_SITE_OTHER): Payer: Medicare HMO | Admitting: Family Medicine

## 2023-04-10 ENCOUNTER — Encounter: Payer: Self-pay | Admitting: Family Medicine

## 2023-04-10 VITALS — BP 124/82 | HR 66 | Ht 62.0 in | Wt 159.0 lb

## 2023-04-10 DIAGNOSIS — I10 Essential (primary) hypertension: Secondary | ICD-10-CM | POA: Diagnosis not present

## 2023-04-10 DIAGNOSIS — Z23 Encounter for immunization: Secondary | ICD-10-CM | POA: Diagnosis not present

## 2023-04-10 DIAGNOSIS — E785 Hyperlipidemia, unspecified: Secondary | ICD-10-CM

## 2023-04-10 DIAGNOSIS — I5032 Chronic diastolic (congestive) heart failure: Secondary | ICD-10-CM | POA: Diagnosis not present

## 2023-04-10 DIAGNOSIS — Z7689 Persons encountering health services in other specified circumstances: Secondary | ICD-10-CM

## 2023-04-10 DIAGNOSIS — E1169 Type 2 diabetes mellitus with other specified complication: Secondary | ICD-10-CM

## 2023-04-10 LAB — CBC WITH DIFFERENTIAL/PLATELET
Eosinophils Absolute: 110 cells/uL (ref 15–500)
Eosinophils Relative: 1.9 %
Hemoglobin: 12 g/dL (ref 11.7–15.5)
MCH: 30.5 pg (ref 27.0–33.0)
MCHC: 33.6 g/dL (ref 32.0–36.0)
MPV: 10.9 fL (ref 7.5–12.5)
RBC: 3.94 10*6/uL (ref 3.80–5.10)

## 2023-04-10 MED ORDER — LOSARTAN POTASSIUM 100 MG PO TABS: 100.0000 mg | ORAL_TABLET | Freq: Every day | ORAL | 0 refills | Status: DC

## 2023-04-10 MED ORDER — PREVNAR 20 0.5 ML IM SUSY
0.5000 mL | PREFILLED_SYRINGE | Freq: Once | INTRAMUSCULAR | 0 refills | Status: AC
Start: 2023-04-10 — End: 2023-04-10

## 2023-04-10 MED ORDER — GVOKE HYPOPEN 2-PACK 1 MG/0.2ML ~~LOC~~ SOAJ
1.0000 mg | SUBCUTANEOUS | 2 refills | Status: AC | PRN
Start: 2023-04-10 — End: ?

## 2023-04-10 NOTE — Progress Notes (Signed)
Subjective:    Patient ID: Tracy Wagner, female    DOB: 06-Mar-1951, 72 y.o.   MRN: [id number]  Tracy Wagner is a 72 y.o. female presenting on 04/10/2023 for Establish Care and New Patient (Initial Visit)  Previously followed by PCP Dr Burnis Medin in [place]. Living currently with sister and brother in law now until her house is ready.   HPI  CHRONIC DM, Type 2: History of Cataracts, s/p removal Reports no concerns, in past has had hyper and hypoglycemia, no recent A1c. Not checking CBG at home regularly CBGs: not checking now Meds: Glipizide 5mg  twice a day with meal. - She has discontinued Metformin  Reports good compliance. Tolerating well w/o side-effects Currently on ARB  Lifestyle: - Diet (limit carb sugars, not always adhering to it)  [facility], recently Cataracts surgery in [place]., Will request copy of Diabetic Eye Exam. Denies hypoglycemia, polyuria, visual changes, numbness or tingling.  Centrilobular Emphysema Former smoker Quit 1988 She has some seasonal allergies and past history emphysema, triggers can cause flares, infrequent She has Albuterol AS NEEDED only. No daily maintenance  GERD On Dexilant 60mg  TWICE A DAY But insurance will not cover in future due to high cost. Will need alternative Pantoprazole vs Omeprazole  CHRONIC HTN: Reports BP has been controlled Current Meds - Losartan 100mg  daily, Carvedilol 12.5mg  BID   Reports good compliance, took meds today. Tolerating well, w/o complaints. Denies CP, dyspnea, HA, edema, dizziness / lightheadedness   Health Maintenance: Declines Flu Vaccine  She had Pneumonia vaccine pre 66, and would like to upgrade to Prevnar 20  Last 3 mammograms negative. She opts to decline further screening. Despite breast cancer history.  Last Colonoscopy uncertain date / prior cologuard, will try to review outside records.     04/10/2023   10:44 AM  Depression screen PHQ 2/9  Decreased Interest 0  Down,  Depressed, Hopeless 0  PHQ - 2 Score 0      04/10/2023   10:44 AM  GAD 7 : Generalized Anxiety Score  Nervous, Anxious, on Edge 0  Control/stop worrying 0  Worry too much - different things 1  Trouble relaxing 0  Restless 0  Easily annoyed or irritable 0  Afraid - awful might happen 0  Total GAD 7 Score 1  Anxiety Difficulty Not difficult at all      Past Medical History:  Diagnosis Date   ACS (acute coronary syndrome) (HCC) 01/15/2018   Allergic rhinitis with postnasal drip    Breast cancer (HCC) 2005   lumpectomy and XRT left breast   Chest pain 10/12/2017   Chronic constipation    Chronic diastolic heart failure (HCC) 01/15/2018   COPD (chronic obstructive pulmonary disease) (HCC) 01/15/2018   COPD mixed type (HCC)    Costochondritis 01/15/2018   DM (diabetes mellitus) (HCC) 01/15/2018   Gastroesophageal reflux disease without esophagitis    GERD (gastroesophageal reflux disease) 01/15/2018   Hypertension, benign    Hypertensive heart disease 09/24/2017   Macular degeneration of right eye    Major depressive disorder in partial remission (HCC) 01/15/2018   Multilevel degenerative disc disease    Nausea and vomiting 10/12/2017   Pitting edema    SOB (shortness of breath) 09/24/2017   Tobacco abuse    Type 2 diabetes mellitus with hyperlipidemia [facility])    Past Surgical History:  Procedure Laterality Date   CATARACT EXTRACTION Bilateral    CHOLECYSTECTOMY     HERNIA REPAIR  2015  VAGINAL HYSTERECTOMY  1988   Social History   Socioeconomic History   Marital status: Married    Spouse name: Not on file   Number of children: Not on file   Years of education: Not on file   Highest education level: Not on file  Occupational History   Not on file  Tobacco Use   Smoking status: Former    Types: Cigarettes    Quit date: 09/22/1987    Years since quitting: 35.5   Smokeless tobacco: Never  Vaping Use   Vaping Use: Never used  Substance and Sexual Activity    Alcohol use: No   Drug use: No   Sexual activity: Not on file  Other Topics Concern   Not on file  Social History Narrative   Not on file   Social Determinants of Health   Financial Resource Strain: Not on file  Food Insecurity: Not on file  Transportation Needs: Not on file  Physical Activity: Not on file  Stress: Not on file  Social Connections: Not on file  Intimate Partner Violence: Not on file   Family History  Problem Relation Age of Onset   Throat cancer Mother    Hypertension Mother    Hypertension Father    Heart attack Father    Alzheimer's disease Father    Stroke Father    Diabetes Sister    Prostate cancer Brother    Current Outpatient Medications on File Prior to Visit  Medication Sig   acetaminophen (TYLENOL) 325 MG tablet Take 2 tablets (650 mg total) every 8 (eight) hours as needed by mouth.   albuterol (PROVENTIL HFA;VENTOLIN HFA) 108 (90 Base) MCG/ACT inhaler Inhale 2 puffs into the lungs daily.   aspirin EC 81 MG tablet Take 81 mg by mouth daily.   Blood Glucose Calibration (BAYER CONTOUR VI) by In Vitro route daily.   carvedilol (COREG) 12.5 MG tablet Take 12.5 mg by mouth 2 (two) times daily with a meal.   Cholecalciferol (VITAMIN D-3 PO) Take 25 mcg by mouth daily at 6 (six) AM.   Cyanocobalamin (VITAMIN B12 PO) Take 500 mcg by mouth daily at 6 (six) AM.   dexlansoprazole (DEXILANT) 60 MG capsule Take 60 mg by mouth 2 (two) times daily at 8 am and 10 pm.   glipiZIDE (GLUCOTROL XL) 5 MG 24 hr tablet Take 1 tablet (5 mg total) by mouth daily with breakfast.   glucose blood test strip 1 each by Other route as needed for other. Use as instructed   MICROLET LANCETS MISC by Does not apply route daily.   Multiple Vitamins-Minerals (PRESERVISION AREDS 2 PO) Take by mouth 2 (two) times daily.   rosuvastatin (CRESTOR) 20 MG tablet Take 20 mg by mouth at bedtime.   No current facility-administered medications on file prior to visit.    Review of  Systems Per HPI unless specifically indicated above      Objective:    BP 124/82   Pulse 66   Ht 5\' 2"  (1.575 m)   Wt 159 lb (72.1 kg)   SpO2 96%   BMI 29.08 kg/m   Wt Readings from Last 3 Encounters:  04/10/23 159 lb (72.1 kg)  01/28/18 178 lb (80.7 kg)  10/12/17 187 lb (84.8 kg)    Physical Exam Vitals and nursing note reviewed.  Constitutional:      General: She is not in acute distress.    Appearance: She is well-developed. She is not diaphoretic.     Comments:  Well-appearing, comfortable, cooperative  HENT:     Head: Normocephalic and atraumatic.  Eyes:     General:        Right eye: No discharge.        Left eye: No discharge.     Conjunctiva/sclera: Conjunctivae normal.  Neck:     Thyroid: No thyromegaly.  Cardiovascular:     Rate and Rhythm: Normal rate and regular rhythm.     Heart sounds: Normal heart sounds. No murmur heard. Pulmonary:     Effort: Pulmonary effort is normal. No respiratory distress.     Breath sounds: Normal breath sounds. No wheezing or rales.  Musculoskeletal:        General: Normal range of motion.     Cervical back: Normal range of motion and neck supple.     Right lower leg: No edema.     Left lower leg: No edema.  Lymphadenopathy:     Cervical: No cervical adenopathy.  Skin:    General: Skin is warm and dry.     Findings: No erythema or rash.  Neurological:     Mental Status: She is alert and oriented to person, place, and time.  Psychiatric:        Behavior: Behavior normal.     Comments: Well groomed, good eye contact, normal speech and thoughts     Diabetic Foot Exam - Simple   Simple Foot Form Diabetic Foot exam was performed with the following findings: Yes 04/10/2023 10:47 AM  Visual Inspection See comments: Yes Sensation Testing Intact to touch and monofilament testing bilaterally: Yes Pulse Check Posterior Tibialis and Dorsalis pulse intact bilaterally: Yes Comments S/p bunion surgery. No other deformity or  ulceration. Intact monofilament sensation.      No results found for this or any previous visit.    Assessment & Plan:   Problem List Items Addressed This Visit     Chronic diastolic heart failure (HCC)   Relevant Medications   carvedilol (COREG) 12.5 MG tablet   losartan (COZAAR) 100 MG tablet   Other Relevant Orders   CBC with Differential/Platelet   Type 2 diabetes mellitus with other specified complication (HCC) - Primary   Relevant Medications   losartan (COZAAR) 100 MG tablet   GVOKE HYPOPEN 2-PACK 1 MG/0.2ML SOAJ   glipiZIDE (GLUCOTROL XL) 5 MG 24 hr tablet   Other Relevant Orders   Urine microalbumin-creatinine with uACR   Hemoglobin A1c   Other Visit Diagnoses     Encounter to establish care with new doctor       Hyperlipidemia associated with type 2 diabetes mellitus (HCC)       Relevant Medications   carvedilol (COREG) 12.5 MG tablet   losartan (COZAAR) 100 MG tablet   GVOKE HYPOPEN 2-PACK 1 MG/0.2ML SOAJ   glipiZIDE (GLUCOTROL XL) 5 MG 24 hr tablet   Other Relevant Orders   COMPLETE METABOLIC PANEL WITH GFR   Lipid panel   TSH   Essential hypertension       Relevant Medications   carvedilol (COREG) 12.5 MG tablet   losartan (COZAAR) 100 MG tablet   Other Relevant Orders   CBC with Differential/Platelet   Need for Streptococcus pneumoniae vaccination       Relevant Medications   pneumococcal 20-valent conjugate vaccine (PREVNAR 20) 0.5 ML injection       Establish care with new PCP Review prior records   HYPERTENSION Controlled currently Re ordered Losartan 100mg  daily [facility] days to local [facility], then I  will send to Mail Order. [facility] after lab result review. On Carvedilol updated dose  --------  T2DM Unknown control With other complications cataracts, hypoglycemia, hyperlipidemia  Check Urine microalbumin DM Foot exam Future DM Eye exam  After lab results, we can call you later this week. If the sugar is still elevated, I would  suggest switching medications  Goal to come off Glipizide ER due to risk of hypoglycemia, we will check labs first.  She is off Metformin, not tolerated.  Recommend Rybelsus. After the 30 day free sample, please contact us back if it works well, and we can order the 90 day for there HIGHER dose 7mg  After you increase the dose, likely need to scale back and reduce the Glipizide to ONCE a day only.  GVOKE pen for emergency low sugar, 50s.   Prevnar 20 pneumonia vaccine at pharmacy when ready.    Orders Placed This Encounter  Procedures   Urine microalbumin-creatinine with uACR   COMPLETE METABOLIC PANEL WITH GFR   CBC with Differential/Platelet   Hemoglobin A1c   Lipid panel    Order Specific Question:   Has the patient fasted?    Answer:   Yes   TSH     Meds ordered this encounter  Medications   losartan (COZAAR) 100 MG tablet    Sig: Take 1 tablet (100 mg total) by mouth daily.    Dispense:  30 tablet    Refill:  0   pneumococcal 20-valent conjugate vaccine (PREVNAR 20) 0.5 ML injection    Sig: Inject 0.5 mLs into the muscle once for 1 dose.    Dispense:  0.5 mL    Refill:  0   GVOKE HYPOPEN 2-PACK 1 MG/0.2ML SOAJ    Sig: Inject 1 mg into the skin as needed (hypoglycemia).    Dispense:  1 mL    Refill:  2      Follow up plan: Return in about 3 months (around 07/11/2023) for 3 month DM A1c.  Saralyn Pilar, DO [facility] 04/10/2023, 10:22 AM

## 2023-04-10 NOTE — Patient Instructions (Addendum)
Thank you for coming to the office today.  Re ordered [facility]artan 100mg  [facility], then I will send to [facility] Order.  -------- After lab results, we can call you later this week. If the sugar is still elevated, I would suggest switching medications  Recommend Rybelsus. After the 30 day free sample, please contact us back if it works well, and we can order the 90 day for there HIGHER dose 7mg  After you increase the dose, likely need to scale back and reduce the Glipizide to ONCE a day only.  GVOKE pen for emergency low sugar, 50s.   Prevnar 20 pneumonia vaccine at pharmacy when ready.  -------------------------  Call insurance find cost and coverage of the following - check the following: - Drug Tier, Preferred List, On Formulary - All will require a "Prior Authorization" from [place] first, before you can find out the cost - Find out if there is "Step Therapy" (other medicines required before you can try these)  Once you pick the one you want to try, let me know - we can get a sample ready IF we have it in stock. Then try it - and before running out of medicine, contact me back to order your Rx so we have time to get it processed.  Rybelsus (pill - oral semaglutide or ozempic) - once daily, taken first thing in morning without other meds Ozempic (injection once week Trulicity (injection once week  We can get these newer meds at low cost if you are interested.  3 benefits - 1 significantly reduced A1c sugar, and may be able to reduce or stop metformin in future - 2 reduced appetite and weight loss with good results - 3 cardiovascular risk reduction, less likely to have heart attack/stroke  --------  Your provider would like to you have your annual eye exam. Please contact your current eye doctor or here are some good options for you to contact.   [facility]   Address: [address] [place], [place] [address] Phone: [phone]  Website:  [email or web address]   [facility] [address] [place], [place] [address] Phone: [phone] [email or web address]  [facility]  Address: [address] [place], [place], [place] [address] Phone: [phone]   [facility] [address] [place], [place] [address] Phone: [phone]  [facility] Address: [address] [place], [place], [place] [address]  Phone: [phone]    Please schedule a Follow-up Appointment to: Return in about 3 months (around 07/11/2023) for 3 month DM A1c.  If you have any other questions or concerns, please feel free to call the office or send a message through MyChart. You may also schedule an earlier appointment if necessary.  Additionally, you may be receiving a survey about your experience at our office within a few days to 1 week by e-mail or mail. We value your feedback.  Saralyn Pilar, DO [facility], [place]

## 2023-04-11 LAB — LIPID PANEL
Cholesterol: 130 mg/dL (ref <200)
HDL: 63 mg/dL (ref >=50)
LDL Cholesterol (Calc): 49 mg/dL (calc)
Non-HDL Cholesterol (Calc): 67 mg/dL (calc) (ref <130)
Total CHOL/HDL Ratio: 2.1 (calc) (ref <5.0)
Triglycerides: 99 mg/dL (ref <150)

## 2023-04-11 LAB — CBC WITH DIFFERENTIAL/PLATELET
Absolute Monocytes: 505 cells/uL (ref 200–950)
Basophils Absolute: 52 cells/uL (ref 0–200)
Basophils Relative: 0.9 %
HCT: 35.7 % (ref 35.0–45.0)
Lymphs Abs: 1728 cells/uL (ref 850–3900)
MCV: 90.6 fL (ref 80.0–100.0)
Monocytes Relative: 8.7 %
Neutro Abs: 3405 cells/uL (ref 1500–7800)
Neutrophils Relative %: 58.7 %
Platelets: 241 10*3/uL (ref 140–400)
RDW: 13 % (ref 11.0–15.0)
Total Lymphocyte: 29.8 %
WBC: 5.8 10*3/uL (ref 3.8–10.8)

## 2023-04-11 LAB — COMPLETE METABOLIC PANEL WITH GFR
AG Ratio: 1.7 (calc) (ref 1.0–2.5)
ALT: 11 U/L (ref 6–29)
AST: 18 U/L (ref 10–35)
Albumin: 4.3 g/dL (ref 3.6–5.1)
Alkaline phosphatase (APISO): 78 U/L (ref 37–153)
BUN/Creatinine Ratio: 17 (calc) (ref 6–22)
BUN: 18 mg/dL (ref 7–25)
CO2: 26 mmol/L (ref 20–32)
Calcium: 9.6 mg/dL (ref 8.6–10.4)
Chloride: 106 mmol/L (ref 98–110)
Creat: 1.03 mg/dL — ABNORMAL HIGH (ref 0.60–1.00)
Globulin: 2.5 g/dL (calc) (ref 1.9–3.7)
Glucose, Bld: 133 mg/dL — ABNORMAL HIGH (ref 65–99)
Potassium: 4.9 mmol/L (ref 3.5–5.3)
Sodium: 142 mmol/L (ref 135–146)
Total Bilirubin: 0.4 mg/dL (ref 0.2–1.2)
Total Protein: 6.8 g/dL (ref 6.1–8.1)
eGFR: 58 mL/min/{1.73_m2} — ABNORMAL LOW (ref >=60)

## 2023-04-11 LAB — HEMOGLOBIN A1C
Hgb A1c MFr Bld: 7.5 % of total Hgb — ABNORMAL HIGH (ref <5.7)
Mean Plasma Glucose: 169 mg/dL
eAG (mmol/L): 9.3 mmol/L

## 2023-04-11 LAB — MICROALBUMIN / CREATININE URINE RATIO
Creatinine, Urine: 59 mg/dL (ref 20–275)
Microalb Creat Ratio: 58 mg/g creat — ABNORMAL HIGH (ref <30)
Microalb, Ur: 3.4 mg/dL

## 2023-04-11 LAB — TSH: TSH: 1.39 mIU/L (ref 0.40–4.50)

## 2023-04-19 ENCOUNTER — Telehealth: Payer: Self-pay | Admitting: Family Medicine

## 2023-05-03 ENCOUNTER — Other Ambulatory Visit: Payer: Self-pay | Admitting: Family Medicine

## 2023-05-03 DIAGNOSIS — E1169 Type 2 diabetes mellitus with other specified complication: Secondary | ICD-10-CM

## 2023-05-03 DIAGNOSIS — I10 Essential (primary) hypertension: Secondary | ICD-10-CM

## 2023-05-03 NOTE — Telephone Encounter (Signed)
Requested medication (s) are due for refill today:Yes  Requested medication (s) are on the active medication list: Yes  Last refill:  04/10/23  Future visit scheduled: Yes  Notes to clinic:  Pharmacy requests 90 day supply and diagnosis code.    Requested Prescriptions  Pending Prescriptions Disp Refills   losartan (COZAAR) 100 MG tablet [Pharmacy Med Name: LOSARTAN POTASSIUM 100 MG TAB] 90 tablet 1    Sig: TAKE 1 TABLET BY MOUTH EVERY DAY     Cardiovascular:  Angiotensin Receptor Blockers Failed - 05/03/2023 10:32 AM      Failed - Cr in normal range and within 180 days    Creat  Date Value Ref Range Status  04/10/2023 1.03 (H) 0.60 - 1.00 mg/dL Final   Creatinine, Urine  Date Value Ref Range Status  04/10/2023 59 20 - 275 mg/dL Final         Passed - K in normal range and within 180 days    Potassium  Date Value Ref Range Status  04/10/2023 4.9 3.5 - 5.3 mmol/L Final         Passed - Patient is not pregnant      Passed - Last BP in normal range    BP Readings from Last 1 Encounters:  04/10/23 124/82         Passed - Valid encounter within last 6 months    Recent Outpatient Visits           3 weeks ago Type 2 diabetes mellitus with other specified complication, without long-term current use of insulin [facility])   [facility] Smitty Cords, DO       Future Appointments             In 2 months Althea Charon, Netta Neat, DO [facility], [facility]

## 2023-05-07 ENCOUNTER — Encounter: Payer: Self-pay | Admitting: Family Medicine

## 2023-05-07 ENCOUNTER — Ambulatory Visit (INDEPENDENT_AMBULATORY_CARE_PROVIDER_SITE_OTHER): Payer: Medicare HMO | Admitting: Family Medicine

## 2023-05-07 VITALS — BP 154/78 | HR 66 | Temp 98.1°F | Resp 18 | Ht 62.0 in | Wt 160.0 lb

## 2023-05-07 DIAGNOSIS — E1142 Type 2 diabetes mellitus with diabetic polyneuropathy: Secondary | ICD-10-CM | POA: Diagnosis not present

## 2023-05-07 DIAGNOSIS — E1169 Type 2 diabetes mellitus with other specified complication: Secondary | ICD-10-CM | POA: Diagnosis not present

## 2023-05-07 DIAGNOSIS — I10 Essential (primary) hypertension: Secondary | ICD-10-CM | POA: Diagnosis not present

## 2023-05-07 MED ORDER — GABAPENTIN 100 MG PO CAPS
ORAL_CAPSULE | ORAL | 0 refills | Status: DC
Start: 2023-05-07 — End: 2023-05-18

## 2023-05-07 MED ORDER — LOSARTAN POTASSIUM 100 MG PO TABS: 100.0000 mg | ORAL_TABLET | Freq: Every day | ORAL | 1 refills | Status: DC

## 2023-05-07 MED ORDER — RYBELSUS 3 MG PO TABS
3.0000 mg | ORAL_TABLET | Freq: Every day | ORAL | 0 refills | Status: DC
Start: 2023-05-07 — End: 2023-05-18

## 2023-05-07 NOTE — Progress Notes (Signed)
Subjective:    Patient ID: Tracy Wagner, female    DOB: 17-Nov-1951, 71 y.o.   MRN: [id number]  Tracy Wagner is a 71 y.o. female presenting on 05/07/2023 for Edema (Pt complains of swelling in her hands and feet x 2 weeks. The swelling seems to worsen during the morning and night. She also complains of persistent throbbing pain. She is currently treating the symptoms with Tylenol Arthritis  twice daily with not much improvement.)   HPI  Peripheral Neuropathy / Lower Extremity L>R Paresthesia upper and lower extremity Stinging and swelling, paresthesia worse at night Also in feet, walking on pins and needles, stinging pain Worse at night She will wake up 3-4 times at night and difficulty walking to bathroom Associated with swelling Limited relief Known DM but not on therapy for neuropathy prior      05/07/2023    4:27 PM 04/10/2023   10:44 AM  Depression screen PHQ 2/9  Decreased Interest 2 0  Down, Depressed, Hopeless 1 0  PHQ - 2 Score 3 0  Altered sleeping 2   Tired, decreased energy 2   Change in appetite 2   Feeling bad or failure about yourself  0   Trouble concentrating 0   Moving slowly or fidgety/restless 0   Suicidal thoughts 0   PHQ-9 Score 9   Difficult doing work/chores Somewhat difficult     Social History   Tobacco Use   Smoking status: Former    Types: Cigarettes    Quit date: 09/22/1987    Years since quitting: 35.6   Smokeless tobacco: Never  Vaping Use   Vaping Use: Never used  Substance Use Topics   Alcohol use: No   Drug use: No    Review of Systems Per HPI unless specifically indicated above     Objective:    BP (!) 154/78 (BP Location: Left Arm, Cuff Size: Normal)   Pulse 66   Temp 98.1 F (36.7 C) (Oral)   Resp 18   Ht 5\' 2"  (1.575 m)   Wt 160 lb (72.6 kg)   SpO2 98%   BMI 29.26 kg/m   Wt Readings from Last 3 Encounters:  05/07/23 160 lb (72.6 kg)  04/10/23 159 lb (72.1 kg)  01/28/18 178 lb (80.7 kg)     Physical Exam Vitals and nursing note reviewed.  Constitutional:      General: She is not in acute distress.    Appearance: Normal appearance. She is well-developed. She is not diaphoretic.     Comments: Well-appearing, comfortable, cooperative  HENT:     Head: Normocephalic and atraumatic.  Eyes:     General:        Right eye: No discharge.        Left eye: No discharge.     Conjunctiva/sclera: Conjunctivae normal.  Cardiovascular:     Rate and Rhythm: Normal rate.  Pulmonary:     Effort: Pulmonary effort is normal.  Skin:    General: Skin is warm and dry.     Findings: No erythema or rash.  Neurological:     Mental Status: She is alert and oriented to person, place, and time.  Psychiatric:        Mood and Affect: Mood normal.        Behavior: Behavior normal.        Thought Content: Thought content normal.     Comments: Well groomed, good eye contact, normal speech and thoughts     Diabetic  Foot Exam - Simple   Simple Foot Form Diabetic Foot exam was performed with the following findings: Yes 05/07/2023  4:54 PM  Visual Inspection See comments: Yes Sensation Testing See comments: Yes Pulse Check Posterior Tibialis and Dorsalis pulse intact bilaterally: Yes Comments Bilateral feet great toe plantar callus with small bunion. Reduced monofilament testing sensation L>R      Results for orders placed or performed in visit on 04/10/23  Urine microalbumin-creatinine with uACR  Result Value Ref Range   Creatinine, Urine 59 20 - 275 mg/dL   Microalb, Ur 3.4 mg/dL   Microalb Creat Ratio 58 (H) <30 mg/g creat  COMPLETE METABOLIC PANEL WITH GFR  Result Value Ref Range   Glucose, Bld 133 (H) 65 - 99 mg/dL   BUN 18 7 - 25 mg/dL   Creat 1.61 (H) 0.96 - 1.00 mg/dL   eGFR 58 (L) > OR = 60 mL/min/1.22m2   BUN/Creatinine Ratio 17 6 - 22 (calc)   Sodium 142 135 - 146 mmol/L   Potassium 4.9 3.5 - 5.3 mmol/L   Chloride 106 98 - 110 mmol/L   CO2 26 20 - 32 mmol/L   Calcium  9.6 8.6 - 10.4 mg/dL   Total Protein 6.8 6.1 - 8.1 g/dL   Albumin 4.3 3.6 - 5.1 g/dL   Globulin 2.5 1.9 - 3.7 g/dL (calc)   AG Ratio 1.7 1.0 - 2.5 (calc)   Total Bilirubin 0.4 0.2 - 1.2 mg/dL   Alkaline phosphatase (APISO) 78 37 - 153 U/L   AST 18 10 - 35 U/L   ALT 11 6 - 29 U/L  CBC with Differential/Platelet  Result Value Ref Range   WBC 5.8 3.8 - 10.8 Thousand/uL   RBC 3.94 3.80 - 5.10 Million/uL   Hemoglobin 12.0 11.7 - 15.5 g/dL   HCT 04.5 40.9 - 81.1 %   MCV 90.6 80.0 - 100.0 fL   MCH 30.5 27.0 - 33.0 pg   MCHC 33.6 32.0 - 36.0 g/dL   RDW 91.4 78.2 - 95.6 %   Platelets 241 140 - 400 Thousand/uL   MPV 10.9 7.5 - 12.5 fL   Neutro Abs 3,405 1,500 - 7,800 cells/uL   Lymphs Abs 1,728 850 - 3,900 cells/uL   Absolute Monocytes 505 200 - 950 cells/uL   Eosinophils Absolute 110 15 - 500 cells/uL   Basophils Absolute 52 0 - 200 cells/uL   Neutrophils Relative % 58.7 %   Total Lymphocyte 29.8 %   Monocytes Relative 8.7 %   Eosinophils Relative 1.9 %   Basophils Relative 0.9 %  Hemoglobin A1c  Result Value Ref Range   Hgb A1c MFr Bld 7.5 (H) <5.7 % of total Hgb   Mean Plasma Glucose 169 mg/dL   eAG (mmol/L) 9.3 mmol/L  Lipid panel  Result Value Ref Range   Cholesterol 130 <200 mg/dL   HDL 63 > OR = 50 mg/dL   Triglycerides 99 <213 mg/dL   LDL Cholesterol (Calc) 49 mg/dL (calc)   Total CHOL/HDL Ratio 2.1 <5.0 (calc)   Non-HDL Cholesterol (Calc) 67 <086 mg/dL (calc)  TSH  Result Value Ref Range   TSH 1.39 0.40 - 4.50 mIU/L      Assessment & Plan:   Problem List Items Addressed This Visit     Diabetic polyneuropathy associated with type 2 diabetes mellitus (HCC)    Diabetic Neuropathy, L>R bilateral feet hands Complication of DM No prior treatment Also has some underlying RLS / Insomnia  Start Gabapentin 100mg   capsules, take at night for 2-3 nights only, and then increase to 2 pills at night, then up to max 3 pills at night.  We can keep adjusting dose or add other  med if need in future      Relevant Medications   losartan (COZAAR) 100 MG tablet   gabapentin (NEURONTIN) 100 MG capsule   RYBELSUS 3 MG TABS   Essential hypertension    Elevated BP today Home BP readings improved   Plan:  1.  Continue current BP regimen Losartan 100mg  daily, Carvedilol 12.5mg  BID - Recommend monitoring BP at home and following up with reported readings. 2. Encourage improved lifestyle - low sodium diet, regular exercise 3. Continue monitor BP outside office, bring readings to next visit, if persistently >140/90 or new symptoms notify office sooner       Relevant Medications   losartan (COZAAR) 100 MG tablet   Type 2 diabetes mellitus with other specified complication (HCC) - Primary    Last A1c 7.5 controlled Complications - peripheral neuropathy, diastolic CHF, mood disorder  Plan:  STOP Glipzide Start Rybelsus 30 day sample Starting dose is 3mg  once daily, first thing in morning on empty stomach, sip of water, no other medicines. Wait 30 min before first meal of day.  After 30 days we need to increase dose up to 7 mg - call us to request new rx.  CALL OUR OFFICE BACK In 2-3 WEEKS let me know how doing on the Gabapentin + Rybelsus   Encourage improved lifestyle - low carb, low sugar diet, reduce portion size, continue improving regular exercise  Check CBG , bring log to next visit for review Continue ASA, ARB, Statin DM Foot exam done today        Relevant Medications   losartan (COZAAR) 100 MG tablet   RYBELSUS 3 MG TABS     Ordered Losartan to Mail Order - [facility]   Meds ordered this encounter  Medications   losartan (COZAAR) 100 MG tablet    Sig: Take 1 tablet (100 mg total) by mouth daily.    Dispense:  90 tablet    Refill:  1    I10   gabapentin (NEURONTIN) 100 MG capsule    Sig: Start 1 capsule daily, increase by 1 cap every 2-3 days as tolerated up to max dose of 3 at once in evening.    Dispense:  90 capsule    Refill:  0    RYBELSUS 3 MG TABS    Sig: Take 1 tablet (3 mg total) by mouth daily.    Dispense:  30 tablet    Refill:  0     Follow up plan: Return if symptoms worsen or fail to improve.   Saralyn Pilar, DO [facility] 05/07/2023, 4:52 PM

## 2023-05-07 NOTE — Assessment & Plan Note (Signed)
Last A1c 7.5 controlled Complications - peripheral neuropathy, diastolic CHF, mood disorder  Plan:  STOP Glipzide Start Rybelsus 30 day sample Starting dose is 3mg  once daily, first thing in morning on empty stomach, sip of water, no other medicines. Wait 30 min before first meal of day.  After 30 days we need to increase dose up to 7 mg - call us to request new rx.  CALL OUR OFFICE BACK In 2-3 WEEKS let me know how doing on the Gabapentin + Rybelsus   Encourage improved lifestyle - low carb, low sugar diet, reduce portion size, continue improving regular exercise  Check CBG , bring log to next visit for review Continue ASA, ARB, Statin DM Foot exam done today

## 2023-05-07 NOTE — Patient Instructions (Addendum)
Thank you for coming to the office today.  Start Gabapentin 100mg  capsules, take at night for 2-3 nights only, and then increase to 2 pills at night, then up to max 3 pills at night.  We can keep adjusting dose or add other med if need in future  Ordered Losartan to Mail Order - [facility]  STOP Glipzide  Start Rybelsus 30 day sample Starting dose is 3mg  once daily, first thing in morning on empty stomach, sip of water, no other medicines. Wait 30 min before first meal of day.  After 30 days we need to increase dose up to 7 mg - call us to request new rx.  CALL OUR OFFICE BACK In 2-3 WEEKS let me know how doing on the Gabapentin + Rybelsus   Please schedule a Follow-up Appointment to: Return if symptoms worsen or fail to improve.  If you have any other questions or concerns, please feel free to call the office or send a message through MyChart. You may also schedule an earlier appointment if necessary.  Additionally, you may be receiving a survey about your experience at our office within a few days to 1 week by e-mail or mail. We value your feedback.  Saralyn Pilar, DO [facility] [place], [place]

## 2023-05-07 NOTE — Assessment & Plan Note (Signed)
Elevated BP today Home BP readings improved   Plan:  1.  Continue current BP regimen Losartan 100mg  daily, Carvedilol 12.5mg  BID - Recommend monitoring BP at home and following up with reported readings. 2. Encourage improved lifestyle - low sodium diet, regular exercise 3. Continue monitor BP outside office, bring readings to next visit, if persistently >140/90 or new symptoms notify office sooner

## 2023-05-07 NOTE — Assessment & Plan Note (Addendum)
Diabetic Neuropathy, L>R bilateral feet hands Complication of DM No prior treatment Also has some underlying RLS / Insomnia  Start Gabapentin 100mg  capsules, take at night for 2-3 nights only, and then increase to 2 pills at night, then up to max 3 pills at night.  We can keep adjusting dose or add other med if need in future

## 2023-05-17 ENCOUNTER — Telehealth: Payer: Self-pay

## 2023-05-17 DIAGNOSIS — E1169 Type 2 diabetes mellitus with other specified complication: Secondary | ICD-10-CM

## 2023-05-17 DIAGNOSIS — E1142 Type 2 diabetes mellitus with diabetic polyneuropathy: Secondary | ICD-10-CM

## 2023-05-17 NOTE — Telephone Encounter (Signed)
Please notify patient that I will re order both meds.  Rybelsus dose increase to 7mg  daily now. With refills.  However for the Gabapentin, she would need to tell us how she is taking it. I always order it with instructions to start at low initial dose and may gradually increase. I will wait to order until she provides her current dosing regimen and pill per day count.  Thank you  Saralyn Pilar, DO [facility] 05/17/2023, 11:07 AM

## 2023-05-17 NOTE — Telephone Encounter (Signed)
Attempted to reach patient regarding message.  No answer and no voicemail.  Will try again later.

## 2023-05-17 NOTE — Telephone Encounter (Signed)
Copied from CRM [id number]. Topic: General - Other >> May 17, 2023  8:52 AM Turkey B wrote: Reason for CRM: Pt called in states the gabapetin and rybelsus meds are doing well for her and if Dr Kirtland Bouchard wants to calll in the meds to the pharmacy, he can at [id number] - [place], [place] - [address]  Phone: [phone] Fax: [phone]

## 2023-05-17 NOTE — Telephone Encounter (Signed)
Copied from CRM [id number]. Topic: General - Other >> May 16, 2023  2:14 PM Everette C wrote: Reason for CRM: The patient has called to share, as directed by their PCP, that their prescriptions for their blood sugar and feet are doing well  The patient was unable to remember the names of the medications at the time of call with agent but will call back to provide additional information   Please contact further if needed >> May 17, 2023  7:58 AM Herbert Seta T wrote: Was sent to wrong office.

## 2023-05-18 MED ORDER — GABAPENTIN 100 MG PO CAPS: 100.0000 mg | ORAL_CAPSULE | Freq: Three times a day (TID) | ORAL | 1 refills | Status: DC

## 2023-05-18 MED ORDER — RYBELSUS 7 MG PO TABS
7.0000 mg | ORAL_TABLET | Freq: Every day | ORAL | 1 refills | Status: DC
Start: 2023-05-18 — End: 2023-06-14

## 2023-05-18 NOTE — Addendum Note (Signed)
Addended by: Smitty Cords on: 05/18/2023 03:39 PM   Modules accepted: Orders

## 2023-05-25 ENCOUNTER — Other Ambulatory Visit: Payer: Self-pay | Admitting: Family Medicine

## 2023-05-25 DIAGNOSIS — I10 Essential (primary) hypertension: Secondary | ICD-10-CM

## 2023-05-25 DIAGNOSIS — E1169 Type 2 diabetes mellitus with other specified complication: Secondary | ICD-10-CM

## 2023-05-25 NOTE — Telephone Encounter (Signed)
Medication Refill - Medication: losartan (COZAAR) 100 MG tablet   Has the patient contacted their pharmacy? Yes.   Pt told to contact provider  Preferred Pharmacy (with phone number or street name):  [id number] [place], [place] - [address] Phone: [phone]  Fax: [phone]     Has the patient been seen for an appointment in the last year OR does the patient have an upcoming appointment? Yes.    Agent: Please be advised that RX refills may take up to 3 business days. We ask that you follow-up with your pharmacy.

## 2023-05-28 MED ORDER — LOSARTAN POTASSIUM 100 MG PO TABS
100.0000 mg | ORAL_TABLET | Freq: Every day | ORAL | 1 refills | Status: DC
Start: 2023-05-28 — End: 2024-02-06

## 2023-05-28 NOTE — Telephone Encounter (Signed)
Original Rx sent to [facility]- will forward to local pharmacy Requested Prescriptions  Pending Prescriptions Disp Refills   losartan (COZAAR) 100 MG tablet 90 tablet 1    Sig: Take 1 tablet (100 mg total) by mouth daily.     Cardiovascular:  Angiotensin Receptor Blockers Failed - 05/25/2023  3:20 PM      Failed - Cr in normal range and within 180 days    Creat  Date Value Ref Range Status  04/10/2023 1.03 (H) 0.60 - 1.00 mg/dL Final   Creatinine, Urine  Date Value Ref Range Status  04/10/2023 59 20 - 275 mg/dL Final         Failed - Last BP in normal range    BP Readings from Last 1 Encounters:  05/07/23 (!) 154/78         Passed - K in normal range and within 180 days    Potassium  Date Value Ref Range Status  04/10/2023 4.9 3.5 - 5.3 mmol/L Final         Passed - Patient is not pregnant      Passed - Valid encounter within last 6 months    Recent Outpatient Visits           3 weeks ago Type 2 diabetes mellitus with other specified complication, without long-term current use of insulin [facility])   [place] [facility] [place], Netta Neat, DO   1 month ago Type 2 diabetes mellitus with other specified complication, without long-term current use of insulin [facility])   [place] [facility] [place], Netta Neat, DO       Future Appointments             In 1 month Althea Charon, Netta Neat, DO [facility], [facility]

## 2023-06-14 ENCOUNTER — Encounter: Payer: Self-pay | Admitting: Family Medicine

## 2023-06-14 ENCOUNTER — Ambulatory Visit (INDEPENDENT_AMBULATORY_CARE_PROVIDER_SITE_OTHER): Payer: Medicare HMO | Admitting: Family Medicine

## 2023-06-14 VITALS — BP 134/80 | HR 66 | Ht 62.0 in | Wt 160.0 lb

## 2023-06-14 DIAGNOSIS — E1169 Type 2 diabetes mellitus with other specified complication: Secondary | ICD-10-CM | POA: Diagnosis not present

## 2023-06-14 DIAGNOSIS — M19041 Primary osteoarthritis, right hand: Secondary | ICD-10-CM | POA: Diagnosis not present

## 2023-06-14 DIAGNOSIS — M19042 Primary osteoarthritis, left hand: Secondary | ICD-10-CM

## 2023-06-14 DIAGNOSIS — Z1159 Encounter for screening for other viral diseases: Secondary | ICD-10-CM | POA: Diagnosis not present

## 2023-06-14 MED ORDER — RYBELSUS 7 MG PO TABS: 7.0000 mg | ORAL_TABLET | Freq: Every day | ORAL | 5 refills | Status: DC

## 2023-06-14 NOTE — Progress Notes (Signed)
Subjective:    Patient ID: Tracy Wagner, female    DOB: 08-07-1951, 72 y.o.   MRN: [id number]  Tracy Wagner is a 72 y.o. female presenting on 06/14/2023 for Medical Management of Chronic Issues   HPI  CHRONIC DM, Type 2: History of Cataracts, s/p removal She has improved control of diabetes on Rybelsus, finished 3mg  dosing, ready for new rx 7mg , but issue with pharmacy. She is off Glipizide now She has discontinued Metformin  Reports good compliance. Tolerating well w/o side-effects Currently on ARB  Lifestyle: - Diet (limit carb sugars, not always adhering to it)  [facility], recently Cataracts surgery in [place]., Will request copy of Diabetic Eye Exam. Denies hypoglycemia, polyuria, visual changes, numbness or tingling.  Gabapentin improving neuropathy in feet  Bilateral Hand Arthritis Admits soreness swelling episodic joint pain.  Family member with Hepatitis C. And she is requesting lab testing. For screening      06/14/2023   10:44 AM 05/07/2023    4:27 PM 04/10/2023   10:44 AM  Depression screen PHQ 2/9  Decreased Interest 0 2 0  Down, Depressed, Hopeless 0 1 0  PHQ - 2 Score 0 3 0  Altered sleeping 0 2   Tired, decreased energy 0 2   Change in appetite 0 2   Feeling bad or failure about yourself  0 0   Trouble concentrating 0 0   Moving slowly or fidgety/restless 0 0   Suicidal thoughts 0 0   PHQ-9 Score 0 9   Difficult doing work/chores  Somewhat difficult     Social History   Tobacco Use   Smoking status: Former    Current packs/day: 0.00    Types: Cigarettes    Quit date: 09/22/1987    Years since quitting: 35.7   Smokeless tobacco: Never  Vaping Use   Vaping status: Never Used  Substance Use Topics   Alcohol use: No   Drug use: No    Review of Systems Per HPI unless specifically indicated above     Objective:    BP 134/80 (BP Location: Left Arm, Cuff Size: Normal)   Pulse 66   Ht 5\' 2"  (1.575 m)   Wt 160 lb (72.6  kg)   SpO2 98%   BMI 29.26 kg/m   Wt Readings from Last 3 Encounters:  06/14/23 160 lb (72.6 kg)  05/07/23 160 lb (72.6 kg)  04/10/23 159 lb (72.1 kg)    Physical Exam Vitals and nursing note reviewed.  Constitutional:      General: She is not in acute distress.    Appearance: Normal appearance. She is well-developed. She is not diaphoretic.     Comments: Well-appearing, comfortable, cooperative  HENT:     Head: Normocephalic and atraumatic.  Eyes:     General:        Right eye: No discharge.        Left eye: No discharge.     Conjunctiva/sclera: Conjunctivae normal.  Cardiovascular:     Rate and Rhythm: Normal rate.  Pulmonary:     Effort: Pulmonary effort is normal.  Skin:    General: Skin is warm and dry.     Findings: No erythema or rash.  Neurological:     Mental Status: She is alert and oriented to person, place, and time.  Psychiatric:        Mood and Affect: Mood normal.        Behavior: Behavior normal.  Thought Content: Thought content normal.     Comments: Well groomed, good eye contact, normal speech and thoughts    Results for orders placed or performed in visit on 04/10/23  Urine microalbumin-creatinine with uACR  Result Value Ref Range   Creatinine, Urine 59 20 - 275 mg/dL   Microalb, Ur 3.4 mg/dL   Microalb Creat Ratio 58 (H) <30 mg/g creat  COMPLETE METABOLIC PANEL WITH GFR  Result Value Ref Range   Glucose, Bld 133 (H) 65 - 99 mg/dL   BUN 18 7 - 25 mg/dL   Creat 4.09 (H) 8.11 - 1.00 mg/dL   eGFR 58 (L) > OR = 60 mL/min/1.41m2   BUN/Creatinine Ratio 17 6 - 22 (calc)   Sodium 142 135 - 146 mmol/L   Potassium 4.9 3.5 - 5.3 mmol/L   Chloride 106 98 - 110 mmol/L   CO2 26 20 - 32 mmol/L   Calcium 9.6 8.6 - 10.4 mg/dL   Total Protein 6.8 6.1 - 8.1 g/dL   Albumin 4.3 3.6 - 5.1 g/dL   Globulin 2.5 1.9 - 3.7 g/dL (calc)   AG Ratio 1.7 1.0 - 2.5 (calc)   Total Bilirubin 0.4 0.2 - 1.2 mg/dL   Alkaline phosphatase (APISO) 78 37 - 153 U/L   AST  18 10 - 35 U/L   ALT 11 6 - 29 U/L  CBC with Differential/Platelet  Result Value Ref Range   WBC 5.8 3.8 - 10.8 Thousand/uL   RBC 3.94 3.80 - 5.10 Million/uL   Hemoglobin 12.0 11.7 - 15.5 g/dL   HCT 91.4 78.2 - 95.6 %   MCV 90.6 80.0 - 100.0 fL   MCH 30.5 27.0 - 33.0 pg   MCHC 33.6 32.0 - 36.0 g/dL   RDW 21.3 08.6 - 57.8 %   Platelets 241 140 - 400 Thousand/uL   MPV 10.9 7.5 - 12.5 fL   Neutro Abs 3,405 1,500 - 7,800 cells/uL   Lymphs Abs 1,728 850 - 3,900 cells/uL   Absolute Monocytes 505 200 - 950 cells/uL   Eosinophils Absolute 110 15 - 500 cells/uL   Basophils Absolute 52 0 - 200 cells/uL   Neutrophils Relative % 58.7 %   Total Lymphocyte 29.8 %   Monocytes Relative 8.7 %   Eosinophils Relative 1.9 %   Basophils Relative 0.9 %  Hemoglobin A1c  Result Value Ref Range   Hgb A1c MFr Bld 7.5 (H) <5.7 % of total Hgb   Mean Plasma Glucose 169 mg/dL   eAG (mmol/L) 9.3 mmol/L  Lipid panel  Result Value Ref Range   Cholesterol 130 <200 mg/dL   HDL 63 > OR = 50 mg/dL   Triglycerides 99 <469 mg/dL   LDL Cholesterol (Calc) 49 mg/dL (calc)   Total CHOL/HDL Ratio 2.1 <5.0 (calc)   Non-HDL Cholesterol (Calc) 67 <629 mg/dL (calc)  TSH  Result Value Ref Range   TSH 1.39 0.40 - 4.50 mIU/L      Assessment & Plan:   Problem List Items Addressed This Visit     Type 2 diabetes mellitus with other specified complication (HCC) - Primary   Relevant Medications   RYBELSUS 7 MG TABS   Other Visit Diagnoses     Need for hepatitis C screening test       Relevant Orders   Hepatitis C antibody   Primary osteoarthritis of both hands          Type 2 Diabetes Remain OFF GLipizide Restart Rybelsus GLP Re ordered Rybelsus  7mg , [facility] with [facility]ills  Hep C antibody test today.  For Hands START anti inflammatory topical - OTC Voltaren (generic Diclofenac) topical 2-4 times a day as needed for pain swelling of affected joint for 1-2 weeks or longer.   Meds ordered this  encounter  Medications   RYBELSUS 7 MG TABS    Sig: Take 1 tablet (7 mg total) by mouth daily before breakfast. Take on empty stomach, 30 min before food and medicine.    Dispense:  30 tablet    Refill:  5      Follow up plan: Return if symptoms worsen or fail to improve.  Saralyn Pilar, DO [facility] 06/14/2023, 11:02 AM

## 2023-06-14 NOTE — Patient Instructions (Addendum)
Thank you for coming to the office today.  Re ordered Rybelsus 7mg , [facility] with 5 refills Let me know how it goes  Hep C antibody test today.  For Hands START anti inflammatory topical - OTC Voltaren (generic Diclofenac) topical 2-4 times a day as needed for pain swelling of affected joint for 1-2 weeks or longer.   Please schedule a Follow-up Appointment to: Return if symptoms worsen or fail to improve.  If you have any other questions or concerns, please feel free to call the office or send a message through MyChart. You may also schedule an earlier appointment if necessary.  Additionally, you may be receiving a survey about your experience at our office within a few days to 1 week by e-mail or mail. We value your feedback.  Saralyn Pilar, DO [facility], [place]

## 2023-06-15 LAB — HEPATITIS C ANTIBODY: Hepatitis C Ab: NONREACTIVE

## 2023-07-11 ENCOUNTER — Ambulatory Visit: Payer: Medicare HMO | Admitting: Family Medicine

## 2023-07-16 ENCOUNTER — Encounter: Payer: Self-pay | Admitting: Family Medicine

## 2023-07-16 ENCOUNTER — Ambulatory Visit: Payer: 59 | Admitting: Family Medicine

## 2023-07-16 VITALS — BP 116/62 | HR 72 | Temp 96.8°F | Wt 161.0 lb

## 2023-07-16 DIAGNOSIS — E1142 Type 2 diabetes mellitus with diabetic polyneuropathy: Secondary | ICD-10-CM | POA: Diagnosis not present

## 2023-07-16 DIAGNOSIS — E1169 Type 2 diabetes mellitus with other specified complication: Secondary | ICD-10-CM

## 2023-07-16 LAB — POCT GLYCOSYLATED HEMOGLOBIN (HGB A1C): Hemoglobin A1C: 7.4 % — AB (ref 4.0–5.6)

## 2023-07-16 NOTE — Progress Notes (Signed)
Subjective:    Patient ID: Tracy Wagner, female    DOB: Dec 23, 1950, 72 y.o.   MRN: [id number]  Tracy Wagner is a 72 y.o. female presenting on 07/16/2023 for Diabetes   HPI  CHRONIC DM, Type 2: History of Cataracts, s/p removal She has improved control of diabetes on Rybelsus, finished 3mg  dosing recently, last visit 1 month ago dose inc to 7mg  daily She has discontinued Metformin and off Glipizide Reports good compliance. Tolerating well w/o side-effects Currently on ARB  Lifestyle: - Diet (limit carb sugars, not always adhering to it)  [facility], recently Cataracts surgery in [place], attempted to request copy of eye exam. Denies hypoglycemia, polyuria, visual changes, numbness or tingling.  Bilateral Hand Arthritis Admits soreness swelling episodic joint pain  Health Maintenance: Future Pneumonia vaccine when ready.     06/14/2023   10:44 AM 05/07/2023    4:27 PM 04/10/2023   10:44 AM  Depression screen PHQ 2/9  Decreased Interest 0 2 0  Down, Depressed, Hopeless 0 1 0  PHQ - 2 Score 0 3 0  Altered sleeping 0 2   Tired, decreased energy 0 2   Change in appetite 0 2   Feeling bad or failure about yourself  0 0   Trouble concentrating 0 0   Moving slowly or fidgety/restless 0 0   Suicidal thoughts 0 0   PHQ-9 Score 0 9   Difficult doing work/chores  Somewhat difficult     Social History   Tobacco Use   Smoking status: Former    Current packs/day: 0.00    Types: Cigarettes    Quit date: 09/22/1987    Years since quitting: 35.8   Smokeless tobacco: Never  Vaping Use   Vaping status: Never Used  Substance Use Topics   Alcohol use: No   Drug use: No    Review of Systems Per HPI unless specifically indicated above     Objective:    BP 116/62 (BP Location: Right Arm, Patient Position: Sitting, Cuff Size: Normal)   Pulse 72   Temp (!) 96.8 F (36 C) (Temporal)   Wt 161 lb (73 kg)   SpO2 96%   BMI 29.45 kg/m   Wt Readings from Last  3 Encounters:  07/16/23 161 lb (73 kg)  06/14/23 160 lb (72.6 kg)  05/07/23 160 lb (72.6 kg)    Physical Exam Vitals and nursing note reviewed.  Constitutional:      General: She is not in acute distress.    Appearance: Normal appearance. She is well-developed. She is not diaphoretic.     Comments: Well-appearing, comfortable, cooperative  HENT:     Head: Normocephalic and atraumatic.  Eyes:     General:        Right eye: No discharge.        Left eye: No discharge.     Conjunctiva/sclera: Conjunctivae normal.  Cardiovascular:     Rate and Rhythm: Normal rate.  Pulmonary:     Effort: Pulmonary effort is normal.  Skin:    General: Skin is warm and dry.     Findings: No erythema or rash.  Neurological:     Mental Status: She is alert and oriented to person, place, and time.  Psychiatric:        Mood and Affect: Mood normal.        Behavior: Behavior normal.        Thought Content: Thought content normal.     Comments: Well  groomed, good eye contact, normal speech and thoughts     Recent Labs    04/10/23 1102 07/16/23 1502  HGBA1C 7.5* 7.4*    Results for orders placed or performed in visit on 07/16/23  POCT glycosylated hemoglobin (Hb A1C)  Result Value Ref Range   Hemoglobin A1C 7.4 (A) 4.0 - 5.6 %      Assessment & Plan:   Problem List Items Addressed This Visit     Diabetic polyneuropathy associated with type 2 diabetes mellitus (HCC)   Type 2 diabetes mellitus with other specified complication (HCC) - Primary    Improved A1c to 7.4 Complications - peripheral neuropathy, diastolic CHF, mood disorder Off GLipizide  Plan:  Continue Rybelsus 7mg  daily   Encourage improved lifestyle - low carb, low sugar diet, reduce portion size, continue improving regular exercise  Check CBG , bring log to next visit for review Continue ASA, ARB, Statin        Relevant Orders   POCT glycosylated hemoglobin (Hb A1C) (Completed)    DM neuropathy + Arthritis  bilateral hands Daily pain and problem has neuropathic symptoms and arthritis On Gabapentin 100mg  THREE TIMES A DAY, doing well for feet but hands still painful.  Dose increase Gabapentin from 1 pill 3 times a day Go up to 1 pill in AM and 2 pills in afternoon and 1 pill in evening Eventually week by week can adjust, may go up to 2 or 3 at one time, per dose, they do make 300mg  in one, we can order a higher if you prefer.  She declined NSAID course, advised to be cautious about this due to risk of gastric ulcer May consider Meloxicam medication anti inflammatory in the future if interested.  No orders of the defined types were placed in this encounter.     Follow up plan: Return in about 3 months (around 10/16/2023) for 3 month DM A1c, Arthritis meds.  Saralyn Pilar, DO [facility] 07/16/2023, 3:09 PM

## 2023-07-16 NOTE — Assessment & Plan Note (Signed)
Improved A1c to 7.4 Complications - peripheral neuropathy, diastolic CHF, mood disorder Off GLipizide  Plan:  Continue Rybelsus 7mg  daily   Encourage improved lifestyle - low carb, low sugar diet, reduce portion size, continue improving regular exercise  Check CBG , bring log to next visit for review Continue ASA, ARB, Statin

## 2023-07-16 NOTE — Patient Instructions (Addendum)
Thank you for coming to the office today.  Recent Labs    04/10/23 1102 07/16/23 1502  HGBA1C 7.5* 7.4*   No change to Rybelsus, keep on this for now. 7mg  daily is working well.  Dose increase Gabapentin from 1 pill 3 times a day Go up to 1 pill in AM and 2 pills in afternoon and 1 pill in evening Eventually week by week can adjust, may go up to 2 or 3 at one time, per dose, they do make 300mg  in one, we can order a higher if you prefer.  May consider Meloxicam medication anti inflammatory in the future if interested.  Please schedule a Follow-up Appointment to: Return in about 3 months (around 10/16/2023) for 3 month DM A1c, Arthritis meds.  If you have any other questions or concerns, please feel free to call the office or send a message through MyChart. You may also schedule an earlier appointment if necessary.  Additionally, you may be receiving a survey about your experience at our office within a few days to 1 week by e-mail or mail. We value your feedback.  Saralyn Pilar, DO [facility] [place]

## 2023-08-02 ENCOUNTER — Encounter: Payer: Self-pay | Admitting: Family Medicine

## 2023-08-20 ENCOUNTER — Other Ambulatory Visit: Payer: Self-pay

## 2023-08-20 ENCOUNTER — Emergency Department
Admission: EM | Admit: 2023-08-20 | Discharge: 2023-08-20 | Disposition: A | Discharge status: Home / Self Care | Payer: 59 | Attending: Student in an Organized Health Care Education/Training Program | Admitting: Student in an Organized Health Care Education/Training Program

## 2023-08-20 ENCOUNTER — Emergency Department: Payer: 59

## 2023-08-20 DIAGNOSIS — Z20822 Contact with and (suspected) exposure to covid-19: Secondary | ICD-10-CM | POA: Diagnosis not present

## 2023-08-20 DIAGNOSIS — R202 Paresthesia of skin: Secondary | ICD-10-CM | POA: Diagnosis not present

## 2023-08-20 DIAGNOSIS — R519 Headache, unspecified: Secondary | ICD-10-CM | POA: Diagnosis not present

## 2023-08-20 DIAGNOSIS — R0789 Other chest pain: Secondary | ICD-10-CM | POA: Diagnosis not present

## 2023-08-20 DIAGNOSIS — R110 Nausea: Secondary | ICD-10-CM | POA: Diagnosis not present

## 2023-08-20 DIAGNOSIS — J449 Chronic obstructive pulmonary disease, unspecified: Secondary | ICD-10-CM | POA: Diagnosis not present

## 2023-08-20 DIAGNOSIS — E119 Type 2 diabetes mellitus without complications: Secondary | ICD-10-CM | POA: Insufficient documentation

## 2023-08-20 DIAGNOSIS — R112 Nausea with vomiting, unspecified: Secondary | ICD-10-CM | POA: Insufficient documentation

## 2023-08-20 DIAGNOSIS — I10 Essential (primary) hypertension: Secondary | ICD-10-CM | POA: Diagnosis not present

## 2023-08-20 DIAGNOSIS — R079 Chest pain, unspecified: Secondary | ICD-10-CM | POA: Diagnosis not present

## 2023-08-20 DIAGNOSIS — I6782 Cerebral ischemia: Secondary | ICD-10-CM | POA: Diagnosis not present

## 2023-08-20 DIAGNOSIS — R1111 Vomiting without nausea: Secondary | ICD-10-CM | POA: Diagnosis not present

## 2023-08-20 LAB — CBC
HCT: 34.8 % — ABNORMAL LOW (ref 36.0–46.0)
Hemoglobin: 11.4 g/dL — ABNORMAL LOW (ref 12.0–15.0)
MCH: 29.7 pg (ref 26.0–34.0)
MCHC: 32.8 g/dL (ref 30.0–36.0)
MCV: 90.6 fL (ref 80.0–100.0)
Platelets: 216 10*3/uL (ref 150–400)
RBC: 3.84 MIL/uL — ABNORMAL LOW (ref 3.87–5.11)
RDW: 12.5 % (ref 11.5–15.5)
WBC: 7 10*3/uL (ref 4.0–10.5)
nRBC: 0 % (ref 0.0–0.2)

## 2023-08-20 LAB — URINALYSIS, ROUTINE W REFLEX MICROSCOPIC
Bilirubin Urine: NEGATIVE
Glucose, UA: NEGATIVE mg/dL
Hgb urine dipstick: NEGATIVE
Ketones, ur: NEGATIVE mg/dL
Leukocytes,Ua: NEGATIVE
Nitrite: NEGATIVE
Protein, ur: NEGATIVE mg/dL
Specific Gravity, Urine: 1.013 (ref 1.005–1.030)
pH: 6 (ref 5.0–8.0)

## 2023-08-20 LAB — COMPREHENSIVE METABOLIC PANEL
ALT: 17 U/L (ref 0–44)
AST: 20 U/L (ref 15–41)
Albumin: 4.2 g/dL (ref 3.5–5.0)
Alkaline Phosphatase: 74 U/L (ref 38–126)
Anion gap: 10 (ref 5–15)
BUN: 23 mg/dL (ref 8–23)
CO2: 22 mmol/L (ref 22–32)
Calcium: 8.9 mg/dL (ref 8.9–10.3)
Chloride: 105 mmol/L (ref 98–111)
Creatinine, Ser: 1 mg/dL (ref 0.44–1.00)
GFR, Estimated: >60 mL/min (ref >60)
Glucose, Bld: 182 mg/dL — ABNORMAL HIGH (ref 70–99)
Potassium: 4.2 mmol/L (ref 3.5–5.1)
Sodium: 137 mmol/L (ref 135–145)
Total Bilirubin: 0.9 mg/dL (ref 0.3–1.2)
Total Protein: 7.1 g/dL (ref 6.5–8.1)

## 2023-08-20 LAB — SARS CORONAVIRUS 2 BY RT PCR: SARS Coronavirus 2 by RT PCR: NEGATIVE

## 2023-08-20 LAB — TROPONIN I (HIGH SENSITIVITY): Troponin I (High Sensitivity): 3 ng/L (ref <18)

## 2023-08-20 LAB — LIPASE, BLOOD: Lipase: 35 U/L (ref 11–51)

## 2023-08-20 MED ORDER — SODIUM CHLORIDE 0.9 % IV BOLUS
500.0000 mL | Freq: Once | INTRAVENOUS | Status: AC
  Administered 2023-08-20: 500 mL via INTRAVENOUS

## 2023-08-20 MED ORDER — ONDANSETRON HCL 4 MG/2ML IJ SOLN
4.0000 mg | Freq: Once | INTRAMUSCULAR | Status: AC
  Administered 2023-08-20: 4 mg via INTRAVENOUS
  Filled 2023-08-20: qty 2

## 2023-08-20 MED ORDER — ONDANSETRON 4 MG PO TBDP: 4.0000 mg | ORAL_TABLET | Freq: Three times a day (TID) | ORAL | 0 refills | Status: DC | PRN

## 2023-08-20 NOTE — ED Triage Notes (Signed)
Pt to ED For n/v started today. 4 mg zofran and NS given PTA. Pt hypertensive on arrival, states took extra dose of bp meds today . Denies pain

## 2023-08-20 NOTE — ED Provider Notes (Signed)
[facility] Provider Note    Event Date/Time   First MD Initiated Contact with Patient 08/20/23 1643     (approximate)   History   Emesis   HPI  Tracy Wagner is a 72 y.o. female with history of type 2 diabetes, COPD, hypertension, ACS presents emergency department with nausea and emesis along with left-sided tingling of the face and left side of her body.  Patient states she almost fell like she was paralyzed.  Yesterday had some chest pain in center of her chest lasted 50 to 60 minutes.  The numbness and tingling started greater than 4 hours ago.  Patient states her blood pressure was elevated so she took a second carvedilol.  Has a headache on the left side of her head   Also states she feels weak.Marland Kitchen      Physical Exam   Triage Vital Signs: ED Triage Vitals [08/20/23 1418]  Encounter Vitals Group     BP (!) 198/77     Systolic BP Percentile      Diastolic BP Percentile      Pulse Rate (!) 59     Resp 18     Temp 98.6 F (37 C)     Temp src      SpO2 95 %     Weight 170 lb (77.1 kg)     Height 5\' 1"  (1.549 m)     Head Circumference      Peak Flow      Pain Score 0     Pain Loc      Pain Education      Exclude from Growth Chart     Most recent vital signs: Vitals:   08/20/23 1651 08/20/23 1821  BP: (!) 188/70 (!) 180/70  Pulse: 60 62  Resp: 16 16  Temp:  98 F (36.7 C)  SpO2: 97% 97%     General: Awake, no distress.   CV:  Good peripheral perfusion. regular rate and  rhythm Resp:  Normal effort. Lungs cta Abd:  No distention. nontender  Other:  Cn 2 through 12 grossly intact, 5/5 strength in the upper and lower extremities, neurovascular intact   ED Results / Procedures / Treatments   Labs (all labs ordered are listed, but only abnormal results are displayed) Labs Reviewed  COMPREHENSIVE METABOLIC PANEL - Abnormal; Notable for the following components:      Result Value   Glucose, Bld 182 (*)    All other  components within normal limits  CBC - Abnormal; Notable for the following components:   RBC 3.84 (*)    Hemoglobin 11.4 (*)    HCT 34.8 (*)    All other components within normal limits  URINALYSIS, ROUTINE W REFLEX MICROSCOPIC - Abnormal; Notable for the following components:   Color, Urine STRAW (*)    APPearance CLEAR (*)    All other components within normal limits  SARS CORONAVIRUS 2 BY RT PCR  LIPASE, BLOOD  TROPONIN I (HIGH SENSITIVITY)     EKG  EKG   RADIOLOGY CT of the head    PROCEDURES:   Procedures   MEDICATIONS ORDERED IN ED: Medications  ondansetron (ZOFRAN) injection 4 mg (4 mg Intravenous Given 08/20/23 1847)  sodium chloride 0.9 % bolus 500 mL (0 mLs Intravenous Stopped 08/20/23 2015)     IMPRESSION / MDM / ASSESSMENT AND PLAN / ED COURSE  I reviewed the triage vital signs and the nursing notes.  Differential diagnosis includes, but is not limited to, cva, subdural, SAH, tia, mi, viral illness  Patient's presentation is most consistent with acute presentation with potential threat to life or bodily function.   Do have concerns for MI or other vascular density due to the patient's chest pain yesterday along with numbness and tingling of the left side of her body today.  Patient does appear to be well.  I do not see any neurological deficits at this time.  However we will do CT of the head to evaluate for stroke.  Patient's blood pressure still elevated and her glucose is elevated.  Will give her fluids.  Clinical Course as of 08/20/23 2226  Mon Aug 20, 2023  1818 Troponin I (High Sensitivity) [SF]    Clinical Course User Index [SF] Faythe Ghee, PA-C   CT of the head independently reviewed and interpreted by me by reading radiologist report is negative for any acute abnormality.  All of the patient's symptoms have resolved.  She is feeling much better after fluids.  I feel that she is stable for discharge.  I did  discuss strict return precautions with her.  She is neurologically intact at discharge.  Appears to be stable.  She is to follow-up with her regular doctor.   FINAL CLINICAL IMPRESSION(S) / ED DIAGNOSES   Final diagnoses:  Nausea and vomiting, unspecified vomiting type  Nonspecific chest pain     Rx / DC Orders   ED Discharge Orders          Ordered    ondansetron (ZOFRAN-ODT) 4 MG disintegrating tablet  Every 8 hours PRN        08/20/23 2002             Note:  This document was prepared using Dragon voice recognition software and may include unintentional dictation errors.    Faythe Ghee, PA-C 08/20/23 2226    Willy Eddy, MD 08/20/23 [phone]

## 2023-08-22 ENCOUNTER — Other Ambulatory Visit: Payer: Self-pay | Admitting: Family Medicine

## 2023-08-22 DIAGNOSIS — E1169 Type 2 diabetes mellitus with other specified complication: Secondary | ICD-10-CM

## 2023-08-22 DIAGNOSIS — I10 Essential (primary) hypertension: Secondary | ICD-10-CM

## 2023-08-22 NOTE — Telephone Encounter (Signed)
Medication Refill - Medication: rosuvastatin (CRESTOR) 20 MG tablet [[id number]]carvedilol (COREG) 12.5 MG tablet [[id number]]  Has the patient contacted their pharmacy? Yes.     Preferred Pharmacy (with phone number or street name):  [id number] [place] [place] - [address]  [address] [place] [address]  Phone: [phone] Fax: [phone]  Hours: Not open 24 hours       Has the patient been seen for an appointment in the last year OR does the patient have an upcoming appointment? Yes.    Agent: Please be advised that RX refills may take up to 3 business days. We ask that you follow-up with your pharmacy.

## 2023-08-23 MED ORDER — ROSUVASTATIN CALCIUM 20 MG PO TABS
20.0000 mg | ORAL_TABLET | Freq: Every day | ORAL | 3 refills | Status: AC
Start: 2023-08-23 — End: ?

## 2023-08-23 MED ORDER — CARVEDILOL 12.5 MG PO TABS: 12.5000 mg | ORAL_TABLET | Freq: Two times a day (BID) | ORAL | 3 refills | Status: AC

## 2023-08-23 NOTE — Telephone Encounter (Signed)
Requested medication (s) are due for refill today: For review.  Quanties not specified.  Requested medication (s) are on the active medication list: yes    Last refill: Crestor  09/21/17   Coreg   04/10/23  Future visit scheduled yes 10/17/23  Notes to clinic:Historical provider, not delegated.  Requested Prescriptions  Pending Prescriptions Disp Refills   rosuvastatin (CRESTOR) 20 MG tablet      Sig: Take 1 tablet (20 mg total) by mouth at bedtime.     Cardiovascular:  Antilipid - Statins 2 Failed - 08/22/2023  8:32 AM      Failed - Lipid Panel in normal range within the last 12 months    Cholesterol  Date Value Ref Range Status  04/10/2023 130 <200 mg/dL Final   LDL Cholesterol (Calc)  Date Value Ref Range Status  04/10/2023 49 mg/dL (calc) Final    Comment:    Reference range: <100 . Desirable range <100 mg/dL for primary prevention;   <70 mg/dL for patients with CHD or diabetic patients  with > or = 2 CHD risk factors. Marland Kitchen LDL-C is now calculated using the Martin-Hopkins  calculation, which is a validated novel method providing  better accuracy than the Friedewald equation in the  estimation of LDL-C.  Horald Pollen et al. Lenox Ahr. 5409;811(91): 2061-2068  (http://education.[employer].com/faq/FAQ164)    HDL  Date Value Ref Range Status  04/10/2023 63 > OR = 50 mg/dL Final   Triglycerides  Date Value Ref Range Status  04/10/2023 99 <150 mg/dL Final         Passed - Cr in normal range and within 360 days    Creat  Date Value Ref Range Status  04/10/2023 1.03 (H) 0.60 - 1.00 mg/dL Final   Creatinine, Ser  Date Value Ref Range Status  08/20/2023 1.00 0.44 - 1.00 mg/dL Final   Creatinine, Urine  Date Value Ref Range Status  04/10/2023 59 20 - 275 mg/dL Final         Passed - Patient is not pregnant      Passed - Valid encounter within last 12 months    Recent Outpatient Visits           1 month ago Type 2 diabetes mellitus with other specified  complication, without long-term current use of insulin (HCC)   [place] [facility] [place], Netta Neat, DO   2 months ago Type 2 diabetes mellitus with other specified complication, without long-term current use of insulin [facility])   [place] [facility] [place], Netta Neat, DO   3 months ago Type 2 diabetes mellitus with other specified complication, without long-term current use of insulin [facility])   [place] [facility] [place], Netta Neat, DO   4 months ago Type 2 diabetes mellitus with other specified complication, without long-term current use of insulin [facility])   [place], Netta Neat, DO       Future Appointments             In 1 month Althea Charon, Netta Neat, DO [place] [facility]             carvedilol (COREG) 12.5 MG tablet      Sig: Take 1 tablet (12.5 mg total) by mouth 2 (two) times daily with a meal.     Cardiovascular: Beta Blockers 3 Failed - 08/22/2023  8:32 AM      Failed - Last BP in  normal range    BP Readings from Last 1 Encounters:  08/20/23 (!) 180/70         Passed - Cr in normal range and within 360 days    Creat  Date Value Ref Range Status  04/10/2023 1.03 (H) 0.60 - 1.00 mg/dL Final   Creatinine, Ser  Date Value Ref Range Status  08/20/2023 1.00 0.44 - 1.00 mg/dL Final   Creatinine, Urine  Date Value Ref Range Status  04/10/2023 59 20 - 275 mg/dL Final         Passed - AST in normal range and within 360 days    AST  Date Value Ref Range Status  08/20/2023 20 15 - 41 U/L Final         Passed - ALT in normal range and within 360 days    ALT  Date Value Ref Range Status  08/20/2023 17 0 - 44 U/L Final         Passed - Last Heart Rate in normal range    Pulse Readings from Last 1 Encounters:  08/20/23 62         Passed - Valid encounter within last 6 months    Recent Outpatient Visits            1 month ago Type 2 diabetes mellitus with other specified complication, without long-term current use of insulin (HCC)   [place] [facility] [place], Netta Neat, DO   2 months ago Type 2 diabetes mellitus with other specified complication, without long-term current use of insulin [facility])   [place] [facility] [place], Netta Neat, DO   3 months ago Type 2 diabetes mellitus with other specified complication, without long-term current use of insulin [facility])   [place] [facility] [place], Netta Neat, DO   4 months ago Type 2 diabetes mellitus with other specified complication, without long-term current use of insulin [facility])   [place] [facility] [place], Netta Neat, DO       Future Appointments             In 1 month Althea Charon, Netta Neat, DO [place] [facility]

## 2023-08-23 NOTE — Telephone Encounter (Signed)
Requested medication (s) are due for refill today:   Requested medication (s) are on the active medication list: Yes  Last refill:    Future visit scheduled: Yes  Notes to clinic:  Historical provider.    Requested Prescriptions  Pending Prescriptions Disp Refills   rosuvastatin (CRESTOR) 20 MG tablet      Sig: Take 1 tablet (20 mg total) by mouth at bedtime.     Cardiovascular:  Antilipid - Statins 2 Failed - 08/22/2023  8:32 AM      Failed - Lipid Panel in normal range within the last 12 months    Cholesterol  Date Value Ref Range Status  04/10/2023 130 <200 mg/dL Final   LDL Cholesterol (Calc)  Date Value Ref Range Status  04/10/2023 49 mg/dL (calc) Final    Comment:    Reference range: <100 . Desirable range <100 mg/dL for primary prevention;   <70 mg/dL for patients with CHD or diabetic patients  with > or = 2 CHD risk factors. Marland Kitchen LDL-C is now calculated using the Martin-Hopkins  calculation, which is a validated novel method providing  better accuracy than the Friedewald equation in the  estimation of LDL-C.  Horald Pollen et al. Lenox Ahr. 4098;119(14): 2061-2068  (http://education.[facility].com/faq/FAQ164)    HDL  Date Value Ref Range Status  04/10/2023 63 > OR = 50 mg/dL Final   Triglycerides  Date Value Ref Range Status  04/10/2023 99 <150 mg/dL Final         Passed - Cr in normal range and within 360 days    Creat  Date Value Ref Range Status  04/10/2023 1.03 (H) 0.60 - 1.00 mg/dL Final   Creatinine, Ser  Date Value Ref Range Status  08/20/2023 1.00 0.44 - 1.00 mg/dL Final   Creatinine, Urine  Date Value Ref Range Status  04/10/2023 59 20 - 275 mg/dL Final         Passed - Patient is not pregnant      Passed - Valid encounter within last 12 months    Recent Outpatient Visits           1 month ago Type 2 diabetes mellitus with other specified complication, without long-term current use of insulin (HCC)   [place] [facility] [place], Netta Neat, DO   2 months ago Type 2 diabetes mellitus with other specified complication, without long-term current use of insulin [facility])   [place] [facility] [place], Netta Neat, DO   3 months ago Type 2 diabetes mellitus with other specified complication, without long-term current use of insulin [facility])   [place] [facility] [place], Netta Neat, DO   4 months ago Type 2 diabetes mellitus with other specified complication, without long-term current use of insulin [facility])   [place] [facility] [place], Netta Neat, DO       Future Appointments             In 1 month Althea Charon, Netta Neat, DO [facility]             carvedilol (COREG) 12.5 MG tablet      Sig: Take 1 tablet (12.5 mg total) by mouth 2 (two) times daily with a meal.     Cardiovascular: Beta Blockers 3 Failed - 08/22/2023  8:32 AM      Failed - Last BP in normal range    BP Readings from Last 1 Encounters:  08/20/23 Marland Kitchen)  180/70         Passed - Cr in normal range and within 360 days    Creat  Date Value Ref Range Status  04/10/2023 1.03 (H) 0.60 - 1.00 mg/dL Final   Creatinine, Ser  Date Value Ref Range Status  08/20/2023 1.00 0.44 - 1.00 mg/dL Final   Creatinine, Urine  Date Value Ref Range Status  04/10/2023 59 20 - 275 mg/dL Final         Passed - AST in normal range and within 360 days    AST  Date Value Ref Range Status  08/20/2023 20 15 - 41 U/L Final         Passed - ALT in normal range and within 360 days    ALT  Date Value Ref Range Status  08/20/2023 17 0 - 44 U/L Final         Passed - Last Heart Rate in normal range    Pulse Readings from Last 1 Encounters:  08/20/23 62         Passed - Valid encounter within last 6 months    Recent Outpatient Visits           1 month ago Type 2 diabetes mellitus with other specified complication, without long-term current  use of insulin (HCC)   [place] [facility] [place], Netta Neat, DO   2 months ago Type 2 diabetes mellitus with other specified complication, without long-term current use of insulin [facility])   [place] [facility] [place], Netta Neat, DO   3 months ago Type 2 diabetes mellitus with other specified complication, without long-term current use of insulin [facility])   [place] [facility] [place], Netta Neat, DO   4 months ago Type 2 diabetes mellitus with other specified complication, without long-term current use of insulin [facility])   [place] [facility] [place], Netta Neat, DO       Future Appointments             In 1 month Althea Charon, Netta Neat, DO [place] [facility]

## 2023-09-04 ENCOUNTER — Telehealth: Payer: Self-pay | Admitting: Family Medicine

## 2023-09-04 NOTE — Telephone Encounter (Signed)
Copied from CRM [id number]. Topic: Medicare AWV >> Sep 04, 2023 11:44 AM Payton Doughty wrote: Reason for CRM: Called LVM 09/04/2023 to schedule AWV   Verlee Rossetti; Care Guide Ambulatory Clinical Support [facility] Direct Dial: [phone]

## 2023-09-27 ENCOUNTER — Telehealth: Payer: Self-pay | Admitting: Family Medicine

## 2023-09-27 DIAGNOSIS — K219 Gastro-esophageal reflux disease without esophagitis: Secondary | ICD-10-CM

## 2023-09-27 MED ORDER — OMEPRAZOLE 40 MG PO CPDR
40.0000 mg | DELAYED_RELEASE_CAPSULE | Freq: Two times a day (BID) | ORAL | 3 refills | Status: AC
Start: 2023-09-27 — End: ?

## 2023-09-27 NOTE — Telephone Encounter (Signed)
I spoke to patient and she did not recall that Dexilant was not covered. She would like to switch to Omeprazole 40 mg BID. [facility] [place].

## 2023-09-27 NOTE — Telephone Encounter (Signed)
Please contact patient about this refill request.  She should be already aware. Dexilant is not covered by her insurance. I saw her earlier this year.  She may be able to follow up with a GI specialist if she has one for this med but I have not been successful in ordering this before.  I can switch it to Omeprazole 40mg  twice a day instead if she prefers.  Thank you  Saralyn Pilar, DO [facility] 09/27/2023, 3:09 PM

## 2023-09-27 NOTE — Telephone Encounter (Signed)
Medication Refill - Medication: dexlansoprazole (DEXILANT) 60 MG capsule Pt is all out needs asap  Has the patient contacted their pharmacy? no Pt states needs a new script is why she called in   Preferred Pharmacy (with phone number or street name):  [id number] [place] [place] [facility] [address] Phone: [phone]  Fax: [phone]     Has the patient been seen for an appointment in the last year OR does the patient have an upcoming appointment? yes  Agent: Please be advised that RX refills may take up to 3 business days. We ask that you follow-up with your pharmacy.

## 2023-09-27 NOTE — Telephone Encounter (Signed)
Requested medication (s) are due for refill today: Yes  Requested medication (s) are on the active medication list: Yes  Last refill:  09/21/17  Future visit scheduled: Yes  Notes to clinic:  Unable to refill per protocol, last refill by another provider. Patient requesting refill.     Requested Prescriptions  Pending Prescriptions Disp Refills   dexlansoprazole (DEXILANT) 60 MG capsule 30 capsule     Sig: Take 1 capsule (60 mg total) by mouth 2 (two) times daily at 8 am and 10 pm.     Gastroenterology: Proton Pump Inhibitors Passed - 09/27/2023 10:00 AM      Passed - Valid encounter within last 12 months    Recent Outpatient Visits           2 months ago Type 2 diabetes mellitus with other specified complication, without long-term current use of insulin [facility])   [place] [facility] [place], Netta Neat, DO   3 months ago Type 2 diabetes mellitus with other specified complication, without long-term current use of insulin [facility])   [place] [facility] [place], Netta Neat, DO   4 months ago Type 2 diabetes mellitus with other specified complication, without long-term current use of insulin [facility])   [place] [facility] [place], Netta Neat, DO   5 months ago Type 2 diabetes mellitus with other specified complication, without long-term current use of insulin [facility])   [place] [facility] [place], Netta Neat, DO       Future Appointments             In 2 weeks [place] Charon, Netta Neat, DO [place] [facility], [facility]

## 2023-09-27 NOTE — Telephone Encounter (Signed)
Understood. It is documented in my note from 03/2023 from my discussion with her. Also, when I attempted to re-order the Dexilant just now it shows that it is not preferred and Omeprazole is preferred when I open the order.  Saralyn Pilar, DO [facility] 09/27/2023, 3:22 PM

## 2023-09-27 NOTE — Addendum Note (Signed)
Addended by: Smitty Cords on: 09/27/2023 03:23 PM   Modules accepted: Orders

## 2023-10-17 ENCOUNTER — Ambulatory Visit: Payer: 59 | Admitting: Family Medicine

## 2023-11-05 ENCOUNTER — Ambulatory Visit: Payer: 59 | Admitting: Family Medicine

## 2023-11-19 ENCOUNTER — Other Ambulatory Visit: Payer: Self-pay | Admitting: Family Medicine

## 2023-11-19 DIAGNOSIS — E1142 Type 2 diabetes mellitus with diabetic polyneuropathy: Secondary | ICD-10-CM

## 2023-11-20 NOTE — Telephone Encounter (Signed)
Requested Prescriptions  Pending Prescriptions Disp Refills   gabapentin (NEURONTIN) 100 MG capsule [Pharmacy Med Name: GABAPENTIN 100 MG CAPSULE] 270 capsule 0    Sig: TAKE 1 CAPSULE (100 MG TOTAL) BY MOUTH THREE TIMES DAILY.     Neurology: Anticonvulsants - gabapentin Passed - 11/20/2023  2:29 PM      Passed - Cr in normal range and within 360 days    Creat  Date Value Ref Range Status  04/10/2023 1.03 (H) 0.60 - 1.00 mg/dL Final   Creatinine, Ser  Date Value Ref Range Status  08/20/2023 1.00 0.44 - 1.00 mg/dL Final   Creatinine, Urine  Date Value Ref Range Status  04/10/2023 59 20 - 275 mg/dL Final         Passed - Completed PHQ-2 or PHQ-9 in the last 360 days      Passed - Valid encounter within last 12 months    Recent Outpatient Visits           4 months ago Type 2 diabetes mellitus with other specified complication, without long-term current use of insulin [facility])   [place] [facility] [place], Netta Neat, DO   5 months ago Type 2 diabetes mellitus with other specified complication, without long-term current use of insulin [facility]   [place] [facility] [place], Netta Neat, DO   6 months ago Type 2 diabetes mellitus with other specified complication, without long-term current use of insulin [facility])   [place] [facility] [place], Netta Neat, DO   7 months ago Type 2 diabetes mellitus with other specified complication, without long-term current use of insulin [facility])   [facility] [place], Netta Neat, [place]

## 2023-12-20 ENCOUNTER — Telehealth: Payer: Self-pay | Admitting: Family Medicine

## 2023-12-20 NOTE — Telephone Encounter (Signed)
Called LVM 12/20/2023 to schedule AWV. Please schedule office or virtual visits.  Verlee Rossetti; Care Guide Ambulatory Clinical Support [facility] [facility] Direct Dial: [phone]

## 2024-02-05 ENCOUNTER — Other Ambulatory Visit: Payer: Self-pay | Admitting: Family Medicine

## 2024-02-05 DIAGNOSIS — E1142 Type 2 diabetes mellitus with diabetic polyneuropathy: Secondary | ICD-10-CM

## 2024-02-05 DIAGNOSIS — I10 Essential (primary) hypertension: Secondary | ICD-10-CM

## 2024-02-05 DIAGNOSIS — E1169 Type 2 diabetes mellitus with other specified complication: Secondary | ICD-10-CM

## 2024-02-06 NOTE — Telephone Encounter (Signed)
 Requested Prescriptions  Pending Prescriptions Disp Refills   gabapentin (NEURONTIN) 100 MG capsule [Pharmacy Med Name: GABAPENTIN 100 MG CAPSULE] 90 capsule 0    Sig: START 1 CAPSULE DAILY, INCREASE BY 1 CAP EVERY 2-3 DAYS AS TOLERATED UP TO MAX DOSE OF 3 AT ONCE IN EVENING.     Neurology: Anticonvulsants - gabapentin Passed - 02/06/2024  1:36 PM      Passed - Cr in normal range and within 360 days    Creat  Date Value Ref Range Status  04/10/2023 1.03 (H) 0.60 - 1.00 mg/dL Final   Creatinine, Ser  Date Value Ref Range Status  08/20/2023 1.00 0.44 - 1.00 mg/dL Final   Creatinine, Urine  Date Value Ref Range Status  04/10/2023 59 20 - 275 mg/dL Final         Passed - Completed PHQ-2 or PHQ-9 in the last 360 days      Passed - Valid encounter within last 12 months    Recent Outpatient Visits           6 months ago Type 2 diabetes mellitus with other specified complication, without long-term current use of insulin [facility])   [place] [facility] [place], Netta Neat, DO   7 months ago Type 2 diabetes mellitus with other specified complication, without long-term current use of insulin [facility])   [place] [facility] [place], Netta Neat, DO   9 months ago Type 2 diabetes mellitus with other specified complication, without long-term current use of insulin [facility])   [place] [facility] [place], Netta Neat, DO   10 months ago Type 2 diabetes mellitus with other specified complication, without long-term current use of insulin (HCC)   [place] [facility] [place], Netta Neat, DO               losartan (COZAAR) 100 MG tablet [Pharmacy Med Name: LOSARTAN POTASSIUM 100 MG TAB] 90 tablet 0    Sig: TAKE 1 TABLET BY MOUTH EVERY DAY     Cardiovascular:  Angiotensin Receptor Blockers Failed - 02/06/2024  1:36 PM      Failed - Last BP in normal range    BP Readings from Last 1 Encounters:  08/20/23  (!) 180/70         Failed - Valid encounter within last 6 months    Recent Outpatient Visits           6 months ago Type 2 diabetes mellitus with other specified complication, without long-term current use of insulin [facility])   [place] [facility] [place], Netta Neat, DO   7 months ago Type 2 diabetes mellitus with other specified complication, without long-term current use of insulin [facility])   [place] [facility] [place], Netta Neat, DO   9 months ago Type 2 diabetes mellitus with other specified complication, without long-term current use of insulin [facility])   [place] [facility] [place], Netta Neat, DO   10 months ago Type 2 diabetes mellitus with other specified complication, without long-term current use of insulin [facility])   [place] [facility] [place], Netta Neat, [place]              Passed - Cr in normal range and within 180 days    Creat  Date Value Ref Range Status  04/10/2023 1.03 (H) 0.60 - 1.00 mg/dL Final   Creatinine, Ser  Date Value Ref Range  Status  08/20/2023 1.00 0.44 - 1.00 mg/dL Final   Creatinine, Urine  Date Value Ref Range Status  04/10/2023 59 20 - 275 mg/dL Final         Passed - K in normal range and within 180 days    Potassium  Date Value Ref Range Status  08/20/2023 4.2 3.5 - 5.1 mmol/L Final         Passed - Patient is not pregnant

## 2024-04-25 DIAGNOSIS — H524 Presbyopia: Secondary | ICD-10-CM | POA: Diagnosis not present

## 2024-04-25 DIAGNOSIS — H52203 Unspecified astigmatism, bilateral: Secondary | ICD-10-CM | POA: Diagnosis not present

## 2024-04-25 DIAGNOSIS — H353231 Exudative age-related macular degeneration, bilateral, with active choroidal neovascularization: Secondary | ICD-10-CM | POA: Diagnosis not present

## 2024-05-03 ENCOUNTER — Other Ambulatory Visit: Payer: Self-pay | Admitting: Family Medicine

## 2024-05-03 DIAGNOSIS — E1169 Type 2 diabetes mellitus with other specified complication: Secondary | ICD-10-CM

## 2024-05-03 DIAGNOSIS — I10 Essential (primary) hypertension: Secondary | ICD-10-CM

## 2024-05-05 DIAGNOSIS — H353132 Nonexudative age-related macular degeneration, bilateral, intermediate dry stage: Secondary | ICD-10-CM | POA: Diagnosis not present

## 2024-05-05 DIAGNOSIS — H1789 Other corneal scars and opacities: Secondary | ICD-10-CM | POA: Diagnosis not present

## 2024-05-05 DIAGNOSIS — H43813 Vitreous degeneration, bilateral: Secondary | ICD-10-CM | POA: Diagnosis not present

## 2024-05-05 NOTE — Telephone Encounter (Signed)
 Requested medication (s) are due for refill today: yes  Requested medication (s) are on the active medication list: yes  Last refill:  02/06/24  Future visit scheduled: no  Notes to clinic:  Unable to refill per protocol, courtesy refill already given, routing for provider approval.      Requested Prescriptions  Pending Prescriptions Disp Refills   losartan  (COZAAR ) 100 MG tablet [Pharmacy Med Name: LOSARTAN  POTASSIUM 100 MG TAB] 90 tablet 0    Sig: TAKE 1 TABLET BY MOUTH EVERY DAY     Cardiovascular:  Angiotensin Receptor Blockers Failed - 05/05/2024  1:42 PM      Failed - Cr in normal range and within 180 days    Creat  Date Value Ref Range Status  04/10/2023 1.03 (H) 0.60 - 1.00 mg/dL Final   Creatinine, Ser  Date Value Ref Range Status  08/20/2023 1.00 0.44 - 1.00 mg/dL Final   Creatinine, Urine  Date Value Ref Range Status  04/10/2023 59 20 - 275 mg/dL Final         Failed - K in normal range and within 180 days    Potassium  Date Value Ref Range Status  08/20/2023 4.2 3.5 - 5.1 mmol/L Final         Failed - Last BP in normal range    BP Readings from Last 1 Encounters:  08/20/23 (!) 180/70         Failed - Valid encounter within last 6 months    Recent Outpatient Visits   None            Passed - Patient is not pregnant

## 2024-05-08 DIAGNOSIS — E785 Hyperlipidemia, unspecified: Secondary | ICD-10-CM | POA: Diagnosis not present

## 2024-05-08 DIAGNOSIS — Z961 Presence of intraocular lens: Secondary | ICD-10-CM | POA: Diagnosis not present

## 2024-05-08 DIAGNOSIS — I10 Essential (primary) hypertension: Secondary | ICD-10-CM | POA: Diagnosis not present

## 2024-05-08 DIAGNOSIS — Z87891 Personal history of nicotine dependence: Secondary | ICD-10-CM | POA: Diagnosis not present

## 2024-05-08 DIAGNOSIS — E119 Type 2 diabetes mellitus without complications: Secondary | ICD-10-CM | POA: Diagnosis not present

## 2024-05-08 DIAGNOSIS — Z9849 Cataract extraction status, unspecified eye: Secondary | ICD-10-CM | POA: Diagnosis not present

## 2024-05-08 DIAGNOSIS — Z853 Personal history of malignant neoplasm of breast: Secondary | ICD-10-CM | POA: Diagnosis not present

## 2024-05-29 ENCOUNTER — Other Ambulatory Visit: Payer: Self-pay | Admitting: Family Medicine

## 2024-05-29 DIAGNOSIS — E1142 Type 2 diabetes mellitus with diabetic polyneuropathy: Secondary | ICD-10-CM

## 2024-06-02 NOTE — Telephone Encounter (Signed)
 Unable to refill per protocol, courtesy refill already given, OV needed.  05/29/2024 Requested Prescriptions  Pending Prescriptions Disp Refills   gabapentin  (NEURONTIN ) 100 MG capsule [Pharmacy Med Name: GABAPENTIN  100 MG CAPSULE] 90 capsule 0    Sig: START 1 CAPSULE DAILY, INCREASE BY 1 CAP EVERY 2-3 DAYS AS TOLERATED UP TO MAX DOSE OF 3 AT ONCE IN EVENING.     Neurology: Anticonvulsants - gabapentin  Failed - 06/02/2024 12:00 PM      Failed - Valid encounter within last 12 months    Recent Outpatient Visits   None            Passed - Cr in normal range and within 360 days    Creat  Date Value Ref Range Status  04/10/2023 1.03 (H) 0.60 - 1.00 mg/dL Final   Creatinine, Ser  Date Value Ref Range Status  08/20/2023 1.00 0.44 - 1.00 mg/dL Final   Creatinine, Urine  Date Value Ref Range Status  04/10/2023 59 20 - 275 mg/dL Final         Passed - Completed PHQ-2 or PHQ-9 in the last 360 days

## 2024-06-04 DIAGNOSIS — H353132 Nonexudative age-related macular degeneration, bilateral, intermediate dry stage: Secondary | ICD-10-CM | POA: Diagnosis not present

## 2024-06-04 DIAGNOSIS — H43813 Vitreous degeneration, bilateral: Secondary | ICD-10-CM | POA: Diagnosis not present

## 2024-06-04 DIAGNOSIS — H1789 Other corneal scars and opacities: Secondary | ICD-10-CM | POA: Diagnosis not present

## 2024-07-02 ENCOUNTER — Other Ambulatory Visit: Payer: Self-pay | Admitting: Family Medicine

## 2024-07-02 DIAGNOSIS — E1169 Type 2 diabetes mellitus with other specified complication: Secondary | ICD-10-CM

## 2024-07-02 DIAGNOSIS — H02831 Dermatochalasis of right upper eyelid: Secondary | ICD-10-CM | POA: Diagnosis not present

## 2024-07-02 DIAGNOSIS — H18413 Arcus senilis, bilateral: Secondary | ICD-10-CM | POA: Diagnosis not present

## 2024-07-02 DIAGNOSIS — H353131 Nonexudative age-related macular degeneration, bilateral, early dry stage: Secondary | ICD-10-CM | POA: Diagnosis not present

## 2024-07-02 DIAGNOSIS — H43812 Vitreous degeneration, left eye: Secondary | ICD-10-CM | POA: Diagnosis not present

## 2024-07-03 NOTE — Telephone Encounter (Signed)
 Requested medications are due for refill today.  yes  Requested medications are on the active medications list.  yes  Last refill. 08/22/2023 #90 1 rf  Future visit scheduled.   no  Notes to clinic.  Medication not assigned to a protocol. Please review for refill.    Requested Prescriptions  Pending Prescriptions Disp Refills   RYBELSUS  7 MG TABS [Pharmacy Med Name: RYBELSUS  7 MG TABLET] 30 tablet 1    Sig: TAKE 1 TABLET BY MOUTH DAILY BEFORE BREAKFAST ON EMPTY STOMACH, 30 MIN BEFORE FOOD AND MEDICINE.     Off-Protocol Failed - 07/03/2024  3:35 PM      Failed - Medication not assigned to a protocol, review manually.      Failed - Valid encounter within last 12 months    Recent Outpatient Visits   None

## 2024-07-21 ENCOUNTER — Other Ambulatory Visit: Payer: Self-pay | Admitting: Family Medicine

## 2024-07-21 DIAGNOSIS — E1142 Type 2 diabetes mellitus with diabetic polyneuropathy: Secondary | ICD-10-CM

## 2024-07-22 NOTE — Telephone Encounter (Signed)
 Unable to refill per protocol, courtesy refill already given, OV needed.  Requested Prescriptions  Pending Prescriptions Disp Refills   gabapentin  (NEURONTIN ) 100 MG capsule [Pharmacy Med Name: GABAPENTIN  100 MG CAPSULE] 90 capsule 0    Sig: START 1 CAPSULE DAILY, INCREASE BY 1 CAP EVERY 2-3 DAYS AS TOLERATED UP TO MAX DOSE OF 3 AT ONCE IN EVENING.     Neurology: Anticonvulsants - gabapentin  Failed - 07/22/2024  1:44 PM      Failed - Completed PHQ-2 or PHQ-9 in the last 360 days      Failed - Valid encounter within last 12 months    Recent Outpatient Visits   None            Passed - Cr in normal range and within 360 days    Creat  Date Value Ref Range Status  04/10/2023 1.03 (H) 0.60 - 1.00 mg/dL Final   Creatinine, Ser  Date Value Ref Range Status  08/20/2023 1.00 0.44 - 1.00 mg/dL Final   Creatinine, Urine  Date Value Ref Range Status  04/10/2023 59 20 - 275 mg/dL Final

## 2024-07-24 DIAGNOSIS — Z9181 History of falling: Secondary | ICD-10-CM | POA: Diagnosis not present

## 2024-07-24 DIAGNOSIS — M1990 Unspecified osteoarthritis, unspecified site: Secondary | ICD-10-CM | POA: Diagnosis not present

## 2024-07-24 DIAGNOSIS — Z23 Encounter for immunization: Secondary | ICD-10-CM | POA: Diagnosis not present

## 2024-07-24 DIAGNOSIS — Z139 Encounter for screening, unspecified: Secondary | ICD-10-CM | POA: Diagnosis not present

## 2024-07-30 ENCOUNTER — Other Ambulatory Visit: Payer: Self-pay | Admitting: Family Medicine

## 2024-07-30 DIAGNOSIS — I10 Essential (primary) hypertension: Secondary | ICD-10-CM

## 2024-07-30 DIAGNOSIS — E1169 Type 2 diabetes mellitus with other specified complication: Secondary | ICD-10-CM

## 2024-07-30 NOTE — Telephone Encounter (Signed)
 OFFICE VISIT NEEDED FOR ADDITIONAL REFILLS.  Requested Prescriptions  Pending Prescriptions Disp Refills   losartan  (COZAAR ) 100 MG tablet [Pharmacy Med Name: LOSARTAN  POTASSIUM 100 MG TAB] 30 tablet 0    Sig: TAKE 1 TABLET BY MOUTH EVERY DAY     Cardiovascular:  Angiotensin Receptor Blockers Failed - 07/30/2024  2:29 PM      Failed - Cr in normal range and within 180 days    Creat  Date Value Ref Range Status  04/10/2023 1.03 (H) 0.60 - 1.00 mg/dL Final   Creatinine, Ser  Date Value Ref Range Status  08/20/2023 1.00 0.44 - 1.00 mg/dL Final   Creatinine, Urine  Date Value Ref Range Status  04/10/2023 59 20 - 275 mg/dL Final         Failed - K in normal range and within 180 days    Potassium  Date Value Ref Range Status  08/20/2023 4.2 3.5 - 5.1 mmol/L Final         Failed - Last BP in normal range    BP Readings from Last 1 Encounters:  08/20/23 (!) 180/70         Failed - Valid encounter within last 6 months    Recent Outpatient Visits   None            Passed - Patient is not pregnant

## 2024-08-01 ENCOUNTER — Encounter: Payer: Self-pay | Admitting: Pharmacist

## 2024-08-01 ENCOUNTER — Other Ambulatory Visit: Payer: Self-pay | Admitting: Family Medicine

## 2024-08-01 DIAGNOSIS — I10 Essential (primary) hypertension: Secondary | ICD-10-CM

## 2024-08-01 NOTE — Telephone Encounter (Signed)
 Requested medication (s) are due for refill today: yes  Requested medication (s) are on the active medication list: yes  Last refill:  08/23/23 #180 3 RF  Future visit scheduled: no  Notes to clinic:  overdue lab work needs appt//called pt and VM full   Requested Prescriptions  Pending Prescriptions Disp Refills   carvedilol  (COREG ) 12.5 MG tablet [Pharmacy Med Name: CARVEDILOL  12.5 MG TABLET] 180 tablet 3    Sig: TAKE 1 TABLET (12.5MG  TOTAL) BY MOUTH TWICE A DAY WITH MEALS     Cardiovascular: Beta Blockers 3 Failed - 08/01/2024 10:47 AM      Failed - Last BP in normal range    BP Readings from Last 1 Encounters:  08/20/23 (!) 180/70         Failed - Valid encounter within last 6 months    Recent Outpatient Visits   None            Passed - Cr in normal range and within 360 days    Creat  Date Value Ref Range Status  04/10/2023 1.03 (H) 0.60 - 1.00 mg/dL Final   Creatinine, Ser  Date Value Ref Range Status  08/20/2023 1.00 0.44 - 1.00 mg/dL Final   Creatinine, Urine  Date Value Ref Range Status  04/10/2023 59 20 - 275 mg/dL Final         Passed - AST in normal range and within 360 days    AST  Date Value Ref Range Status  08/20/2023 20 15 - 41 U/L Final         Passed - ALT in normal range and within 360 days    ALT  Date Value Ref Range Status  08/20/2023 17 0 - 44 U/L Final         Passed - Last Heart Rate in normal range    Pulse Readings from Last 1 Encounters:  08/20/23 62

## 2024-08-01 NOTE — Progress Notes (Signed)
   08/01/2024  Patient ID: Tracy Wagner, female   DOB: [birth year], 73 y.o.   MRN: [id number]  This patient is appearing on a report for the adherence measure for diabetes medications this calendar year.   Medication: Rybelsus  7 mg Last fill date: 05/06/2024 for 30 day supply   From review of chart, note patient last seen for Office Visit with PCP on 07/16/2023.  Was unable to reach patient via telephone today and unable to leave a message as voicemail is full.  Sharyle Sia, PharmD, [facility] [phone]

## 2024-08-04 ENCOUNTER — Encounter: Payer: Self-pay | Admitting: Pharmacist

## 2024-08-04 DIAGNOSIS — H353131 Nonexudative age-related macular degeneration, bilateral, early dry stage: Secondary | ICD-10-CM | POA: Diagnosis not present

## 2024-08-04 DIAGNOSIS — H04123 Dry eye syndrome of bilateral lacrimal glands: Secondary | ICD-10-CM | POA: Diagnosis not present

## 2024-08-04 DIAGNOSIS — H179 Unspecified corneal scar and opacity: Secondary | ICD-10-CM | POA: Diagnosis not present

## 2024-08-04 DIAGNOSIS — H18413 Arcus senilis, bilateral: Secondary | ICD-10-CM | POA: Diagnosis not present

## 2024-08-04 NOTE — Progress Notes (Signed)
   08/04/2024  Patient ID: Tracy Wagner, female   DOB: 1951/04/17, 73 y.o.   MRN: [id number]  This patient is appearing on a report for the adherence measure for diabetes medications this calendar year.   Medication: Rybelsus  7 mg Last fill date: 05/06/2024 for 30 day supply     From review of chart, note patient last seen for Office Visit with PCP on 07/16/2023.   Was unable to reach patient via telephone today and unable to leave a message as voicemail is full. Outreach attempt #2.   Sharyle Sia, PharmD, [facility] [phone]

## 2024-08-10 ENCOUNTER — Other Ambulatory Visit: Payer: Self-pay | Admitting: Family Medicine

## 2024-08-10 DIAGNOSIS — K219 Gastro-esophageal reflux disease without esophagitis: Secondary | ICD-10-CM

## 2024-08-12 NOTE — Telephone Encounter (Signed)
 Unable to refill per protocol, appointment needed.   Requested Prescriptions  Pending Prescriptions Disp Refills   omeprazole  (PRILOSEC) 40 MG capsule [Pharmacy Med Name: OMEPRAZOLE  DR 40 MG CAPSULE] 180 capsule 3    Sig: TAKE 1 CAPSULE (40 MG TOTAL) BY MOUTH 2 (TWO) TIMES DAILY BEFORE A MEAL.     Gastroenterology: Proton Pump Inhibitors Failed - 08/12/2024  9:19 AM      Failed - Valid encounter within last 12 months    Recent Outpatient Visits   None

## 2024-08-12 NOTE — Telephone Encounter (Signed)
 Needs appointment

## 2024-08-13 DIAGNOSIS — E119 Type 2 diabetes mellitus without complications: Secondary | ICD-10-CM | POA: Diagnosis not present

## 2024-08-13 DIAGNOSIS — M79672 Pain in left foot: Secondary | ICD-10-CM | POA: Diagnosis not present

## 2024-08-13 DIAGNOSIS — E785 Hyperlipidemia, unspecified: Secondary | ICD-10-CM | POA: Diagnosis not present

## 2024-08-13 DIAGNOSIS — I10 Essential (primary) hypertension: Secondary | ICD-10-CM | POA: Diagnosis not present

## 2024-08-13 DIAGNOSIS — M47819 Spondylosis without myelopathy or radiculopathy, site unspecified: Secondary | ICD-10-CM | POA: Diagnosis not present

## 2024-08-13 DIAGNOSIS — N1831 Chronic kidney disease, stage 3a: Secondary | ICD-10-CM | POA: Diagnosis not present

## 2024-08-13 DIAGNOSIS — M2550 Pain in unspecified joint: Secondary | ICD-10-CM | POA: Diagnosis not present

## 2024-08-13 DIAGNOSIS — M19072 Primary osteoarthritis, left ankle and foot: Secondary | ICD-10-CM | POA: Diagnosis not present

## 2024-08-17 ENCOUNTER — Other Ambulatory Visit: Payer: Self-pay | Admitting: Family Medicine

## 2024-08-17 DIAGNOSIS — E1169 Type 2 diabetes mellitus with other specified complication: Secondary | ICD-10-CM

## 2024-08-18 NOTE — Telephone Encounter (Signed)
 Requested medications are due for refill today.  yes  Requested medications are on the active medications list.  yes  Last refill. 08/23/2023 #90 3 rf  Future visit scheduled.   no  Notes to clinic.  Labs are expired.    Requested Prescriptions  Pending Prescriptions Disp Refills   rosuvastatin  (CRESTOR ) 20 MG tablet [Pharmacy Med Name: ROSUVASTATIN  CALCIUM  20 MG TAB] 90 tablet 3    Sig: TAKE 1 TABLET BY MOUTH EVERYDAY AT BEDTIME     Cardiovascular:  Antilipid - Statins 2 Failed - 08/18/2024  4:04 PM      Failed - Cr in normal range and within 360 days    Creat  Date Value Ref Range Status  04/10/2023 1.03 (H) 0.60 - 1.00 mg/dL Final   Creatinine, Ser  Date Value Ref Range Status  08/20/2023 1.00 0.44 - 1.00 mg/dL Final   Creatinine, Urine  Date Value Ref Range Status  04/10/2023 59 20 - 275 mg/dL Final         Failed - Valid encounter within last 12 months    Recent Outpatient Visits   None            Failed - Lipid Panel in normal range within the last 12 months    Cholesterol  Date Value Ref Range Status  04/10/2023 130 <200 mg/dL Final   LDL Cholesterol (Calc)  Date Value Ref Range Status  04/10/2023 49 mg/dL (calc) Final    Comment:    Reference range: <100 . Desirable range <100 mg/dL for primary prevention;   <70 mg/dL for patients with CHD or diabetic patients  with > or = 2 CHD risk factors. SABRA LDL-C is now calculated using the Martin-Hopkins  calculation, which is a validated novel method providing  better accuracy than the Friedewald equation in the  estimation of LDL-C.  Gladis APPLETHWAITE et al. SANDREA. 7986;689(80): 2061-2068  (http://education.[employer].com/faq/FAQ164)    HDL  Date Value Ref Range Status  04/10/2023 63 > OR = 50 mg/dL Final   Triglycerides  Date Value Ref Range Status  04/10/2023 99 <150 mg/dL Final         Passed - Patient is not pregnant

## 2024-08-18 NOTE — Telephone Encounter (Signed)
 Needs appointment. No MyChart.

## 2024-08-23 ENCOUNTER — Other Ambulatory Visit: Payer: Self-pay | Admitting: Family Medicine

## 2024-08-23 DIAGNOSIS — E1169 Type 2 diabetes mellitus with other specified complication: Secondary | ICD-10-CM

## 2024-08-23 DIAGNOSIS — I10 Essential (primary) hypertension: Secondary | ICD-10-CM

## 2024-08-26 NOTE — Telephone Encounter (Signed)
 Requested medications are due for refill today.  yes  Requested medications are on the active medications list.  yes  Last refill. 07/30/2024 #30 0 rf  Future visit scheduled.   no  Notes to clinic.  Pt already given a courtesy refill. Pt is more than 3 months overdue for an ov.    Requested Prescriptions  Pending Prescriptions Disp Refills   losartan  (COZAAR ) 100 MG tablet [Pharmacy Med Name: LOSARTAN  POTASSIUM 100 MG TAB] 90 tablet 1    Sig: TAKE 1 TABLET BY MOUTH EVERY DAY     Cardiovascular:  Angiotensin Receptor Blockers Failed - 08/26/2024 11:48 AM      Failed - Cr in normal range and within 180 days    Creat  Date Value Ref Range Status  04/10/2023 1.03 (H) 0.60 - 1.00 mg/dL Final   Creatinine, Ser  Date Value Ref Range Status  08/20/2023 1.00 0.44 - 1.00 mg/dL Final   Creatinine, Urine  Date Value Ref Range Status  04/10/2023 59 20 - 275 mg/dL Final         Failed - K in normal range and within 180 days    Potassium  Date Value Ref Range Status  08/20/2023 4.2 3.5 - 5.1 mmol/L Final         Failed - Last BP in normal range    BP Readings from Last 1 Encounters:  08/20/23 (!) 180/70         Failed - Valid encounter within last 6 months    Recent Outpatient Visits   None            Passed - Patient is not pregnant

## 2024-10-11 DIAGNOSIS — R079 Chest pain, unspecified: Secondary | ICD-10-CM | POA: Diagnosis not present

## 2024-10-11 DIAGNOSIS — I160 Hypertensive urgency: Secondary | ICD-10-CM | POA: Diagnosis not present

## 2024-10-13 ENCOUNTER — Telehealth: Payer: Self-pay

## 2024-10-13 NOTE — Transitions of Care (Post Inpatient/ED Visit) (Signed)
   10/13/2024  Name: Tracy Wagner MRN: [id number] DOB: [birth year]  Today's TOC FU Call Status: Today's TOC FU Call Status:: Unsuccessful Call (1st Attempt) Unsuccessful Call (1st Attempt) Date: 10/13/24  Attempted to reach the patient regarding the most recent Inpatient/ED visit.  Follow Up Plan: Additional outreach attempts will be made to reach the patient to complete the Transitions of Care (Post Inpatient/ED visit) call.   Medford Balboa, BSN, RN [place]  [facility]BCI - [employer] [facility] RN Care Manager [phone]

## 2024-10-17 ENCOUNTER — Telehealth: Payer: Self-pay

## 2024-10-17 NOTE — Telephone Encounter (Signed)
 Tried calling patient no answer or VM    If patient calls back please schedule with Dr Edman prior to the end of the year for a [facility] appointment    Their PCP would like to ensure that they have addressed all of their health problems before the end of the year so that we can start the new year with a plan for all concerns.

## 2024-10-22 NOTE — Progress Notes (Signed)
 Tracy Wagner                                          MRN: [id number]   10/22/2024   The VBCI Quality Team Specialist reviewed this patient medical record for the purposes of chart review for care gap closure. The following were reviewed: chart review for care gap closure-kidney health evaluation for diabetes:eGFR  and uACR.    VBCI Quality Team

## 2024-10-27 DIAGNOSIS — R079 Chest pain, unspecified: Secondary | ICD-10-CM | POA: Diagnosis not present

## 2024-11-10 ENCOUNTER — Other Ambulatory Visit: Payer: Self-pay

## 2024-11-10 DIAGNOSIS — J449 Chronic obstructive pulmonary disease, unspecified: Secondary | ICD-10-CM | POA: Insufficient documentation

## 2024-11-10 DIAGNOSIS — J309 Allergic rhinitis, unspecified: Secondary | ICD-10-CM | POA: Insufficient documentation

## 2024-11-10 DIAGNOSIS — H3530 Unspecified macular degeneration: Secondary | ICD-10-CM | POA: Insufficient documentation

## 2024-11-10 DIAGNOSIS — M539 Dorsopathy, unspecified: Secondary | ICD-10-CM | POA: Insufficient documentation

## 2024-11-10 DIAGNOSIS — Z720 Tobacco use: Secondary | ICD-10-CM | POA: Insufficient documentation

## 2024-11-10 DIAGNOSIS — R609 Edema, unspecified: Secondary | ICD-10-CM | POA: Insufficient documentation

## 2024-11-10 NOTE — Progress Notes (Unsigned)
 Cardiology Office Note:    Date:  11/11/2024   ID:  Tracy Wagner, Tracy Wagner Apr 08, 1951, MRN [id number]  PCP:  Orlando Dwayne NOVAK, FNP  Cardiologist:  Redell Leiter, MD   Referring MD: Orlando Dwayne NOVAK, FNP  ASSESSMENT:    1. Chest pain, unspecified type   2. Primary hypertension   3. Type 2 diabetes mellitus with hyperlipidemia (HCC)   4. Hyperlipidemia, unspecified hyperlipidemia type   5. Precordial pain    PLAN:    In order of problems listed above:  She is having typical anginal symptoms normal myocardial perfusion study and ongoing symptoms despite medical therapy including aspirin high intensity statin beta-blocker and calcium  channel blocker. Cardiac CTA ordered to confirm the diagnosis and guide treatment Hypertension very nicely controlled at this time continue with current medications Her lipids are at target LDL 53 in June continue her high intensity statin  Next appointment 3 months   Medication Adjustments/Labs and Tests Ordered: Current medicines are reviewed at length with the patient today.  Concerns regarding medicines are outlined above.  Orders Placed This Encounter  Procedures   CT CORONARY MORPH W/CTA COR W/SCORE W/CA W/CM &/OR WO/CM   Basic Metabolic Panel (BMET)   EKG 12-Lead   Meds ordered this encounter  Medications   nitroGLYCERIN  (NITROSTAT ) 0.4 MG SL tablet    Sig: Place 1 tablet (0.4 mg total) under the tongue every 5 (five) minutes as needed for chest pain.    Dispense:  25 tablet    Refill:  1     No chief complaint on file.   History of Present Illness:    Tracy Wagner is a 73 y.o. female who is being seen today for the evaluation of chest pain at the request of Orlando Dwayne NOVAK, FNP.  I had seen her previously in 2019 with chest pain typical features of costochondral pain syndrome and had a preceding normal myocardial perfusion image in 2018.  Most recently seen at [facility] health ED 10/26/2024 admission to the  hospital discharge 10/28/2024 she presented with complaints of dizziness nausea vomiting and blood pressure elevated.  Admission diagnosis was hypertensive urgency with chest pressure blood pressure recorded on admission was 123/55 she was mildly anemic with hemoglobin 11.5 creatinine 1.20 CT angio of chest abdomen pelvis had no acute findings she had serial troponins assessed that were normal does not appear to have undergone a cardiac ischemic evaluation.  She is admitted to [facility] 10/10/2024 discharged the next day with what was described as hypertensive urgency although the admission notes that she presented to the hospital complaining of chest pressure and headache chest x-ray was described as showing cardiac enlargement she underwent both echocardiogram and myocardial perfusion study laboratory studies showed mild anemia hemoglobin 11.5 creatinine 1.0 troponin undetected BNP level was quite low 212 cholesterol 124 LDL 44 left ventricular ejection fraction was described as normal and the myocardial perfusion study was described as showing no ischemia.  The official report relates EF 67% with normal myocardial perfusion images.  Report of her echocardiogram describes that the left ventricle is normal in size normal wall thickness normal systolic function impaired relaxation EF 60 to 65%  there is mitral annular calcification with normal valve function mild aortic valve sclerosis without valve regurgitation or stenosis.  There is no pericardial effusion.  She was seen with her PCP 10/22/2024 laboratory studies showed mild increased creatinine 1.08 GFR 55 cc sodium 140 potassium 4.4 during the visit the patient continued to complain of  exertional chest pain with gardening activities she had an EKG performed showing sinus bradycardia 50/min artifact was seen RSR prime in V1 otherwise normal EKG.  Tracy Wagner has a pattern of chronic chest pain.  When she works outdoors in the garden lifting care or  climbs stairs she finds herself quite short of breath has to stop pause and rest to recover and has pressure substernally and takes a clutch fist to her chest for description a Levine sign the pattern has slowly worsened but is stable and has not had episodes on relieved with rest or nocturnally She is concerned she has unrecognized heart disease She is not having edema orthopnea palpitation or syncope Past Medical History:  Diagnosis Date   ACS (acute coronary syndrome) (HCC) 01/15/2018   Allergic rhinitis with postnasal drip    Breast cancer (HCC) 2005   lumpectomy and XRT left breast   Chest pain 10/12/2017   Chronic constipation    Chronic diastolic heart failure (HCC) 01/15/2018   COPD (chronic obstructive pulmonary disease) (HCC) 01/15/2018   COPD mixed type (HCC)    Costochondral chest pain 01/27/2018   Costochondritis 01/15/2018   Diabetic polyneuropathy associated with type 2 diabetes mellitus (HCC) 05/07/2023   DM (diabetes mellitus) (HCC) 01/15/2018   Gastroesophageal reflux disease without esophagitis    GERD (gastroesophageal reflux disease) 01/15/2018   Hypertension, benign    Hypertensive heart disease 09/24/2017   Macular degeneration of right eye    Major depressive disorder in partial remission 01/15/2018   Multilevel degenerative disc disease    Nausea and vomiting 10/12/2017   Pitting edema    SOB (shortness of breath) 09/24/2017   Tobacco abuse    Type 2 diabetes mellitus with hyperlipidemia (HCC)     Past Surgical History:  Procedure Laterality Date   CATARACT EXTRACTION Bilateral    CHOLECYSTECTOMY     HERNIA REPAIR  2015   VAGINAL HYSTERECTOMY  1988    Current Medications: Active Medications[1]   Allergies:   Sulfa antibiotics   Social History   Socioeconomic History   Marital status: Widowed    Spouse name: Not on file   Number of children: Not on file   Years of education: Not on file   Highest education level: Not on file  Occupational  History   Not on file  Tobacco Use   Smoking status: Former    Current packs/day: 0.00    Types: Cigarettes    Quit date: 09/22/1987    Years since quitting: 37.1   Smokeless tobacco: Never  Vaping Use   Vaping status: Never Used  Substance and Sexual Activity   Alcohol use: No   Drug use: No   Sexual activity: Not on file  Other Topics Concern   Not on file  Social History Narrative   Not on file   Social Drivers of Health   Tobacco Use: Medium Risk (11/11/2024)   Patient History    Smoking Tobacco Use: Former    Smokeless Tobacco Use: Never    Passive Exposure: Not on Actuary Strain: Not on file  Food Insecurity: Not on file  Transportation Needs: Not on file  Physical Activity: Not on file  Stress: Not on file  Social Connections: Not on file  Depression (PHQ2-9): Low Risk (06/14/2023)   Depression (PHQ2-9)    PHQ-2 Score: 0  Recent Concern: Depression (PHQ2-9) - Medium Risk (05/07/2023)   Depression (PHQ2-9)    PHQ-2 Score: 9  Alcohol Screen: Low Risk (06/14/2023)  Alcohol Screen    Last Alcohol Screening Score (AUDIT): 0  Housing: Not on file  Utilities: Not on file  Health Literacy: Not on file     Family History: The patient's family history includes Alzheimer's disease in her father; Diabetes in her sister; Heart attack in her father; Hypertension in her father and mother; Prostate cancer in her brother; Stroke in her father; Throat cancer in her mother.  ROS:   ROS Please see the history of present illness.     All other systems reviewed and are negative.  EKGs/Labs/Other Studies Reviewed:    The following studies were reviewed today:   EKG Interpretation Date/Time:  Tuesday November 11 2024 09:43:17 EST Ventricular Rate:  60 PR Interval:  158 QRS Duration:  80 QT Interval:  410 QTC Calculation: 410 R Axis:   54  Text Interpretation: Normal sinus rhythm Normal ECG When compared with ECG of 20-Aug-2023 14:26, No significant  change was found Confirmed by Monetta Rogue ([id number]) on 11/11/2024 9:46:43 AM    Recent Labs: No results found for requested labs within last 365 days.  Recent Lipid Panel    Component Value Date/Time   CHOL 130 04/10/2023 1102   TRIG 99 04/10/2023 1102   HDL 63 04/10/2023 1102   CHOLHDL 2.1 04/10/2023 1102   LDLCALC 49 04/10/2023 1102    Physical Exam:    VS:  BP 112/70   Pulse 60   Ht 5' 2 (1.575 m)   Wt 153 lb 3.2 oz (69.5 kg)   SpO2 96%   BMI 28.02 kg/m     Wt Readings from Last 3 Encounters:  11/11/24 153 lb 3.2 oz (69.5 kg)  08/20/23 170 lb (77.1 kg)  07/16/23 161 lb (73 kg)     GEN:  Well nourished, well developed in no acute distress HEENT: Normal NECK: No JVD; No carotid bruits LYMPHATICS: No lymphadenopathy CARDIAC: RRR, no murmurs, rubs, gallops RESPIRATORY:  Clear to auscultation without rales, wheezing or rhonchi  ABDOMEN: Soft, non-tender, non-distended MUSCULOSKELETAL:  No edema; No deformity  SKIN: Warm and dry NEUROLOGIC:  Alert and oriented x 3 PSYCHIATRIC:  Normal affect     Signed, Rogue Monetta, MD  11/11/2024 10:37 AM    [facility]     [1]  Current Meds  Medication Sig   acetaminophen  (TYLENOL ) 650 MG CR tablet Take 650 mg by mouth in the morning and at bedtime.   albuterol (PROVENTIL HFA;VENTOLIN HFA) 108 (90 Base) MCG/ACT inhaler Inhale 2 puffs into the lungs daily.   amLODipine (NORVASC) 5 MG tablet Take 5 mg by mouth daily.   aspirin EC 81 MG tablet Take 81 mg by mouth daily.   Blood Glucose Calibration (BAYER CONTOUR VI) by In Vitro route daily.   carvedilol  (COREG ) 12.5 MG tablet Take 1 tablet (12.5 mg total) by mouth 2 (two) times daily with a meal.   Cyanocobalamin (VITAMIN B12 PO) Take 500 mcg by mouth daily at 6 (six) AM.   gabapentin  (NEURONTIN ) 100 MG capsule START 1 CAPSULE DAILY, INCREASE BY 1 CAP EVERY 2-3 DAYS AS TOLERATED UP TO MAX DOSE OF 3 AT ONCE IN EVENING.   glucose blood test strip 1  each by Other route as needed for other. Use as instructed   GVOKE HYPOPEN  2-PACK 1 MG/0.2ML SOAJ Inject 1 mg into the skin as needed (hypoglycemia).   losartan  (COZAAR ) 100 MG tablet TAKE 1 TABLET BY MOUTH EVERY DAY   MICROLET LANCETS MISC by Does  not apply route daily.   nitroGLYCERIN  (NITROSTAT ) 0.4 MG SL tablet Place 1 tablet (0.4 mg total) under the tongue every 5 (five) minutes as needed for chest pain.   omeprazole  (PRILOSEC) 40 MG capsule Take 1 capsule (40 mg total) by mouth 2 (two) times daily before a meal.   rosuvastatin  (CRESTOR ) 20 MG tablet Take 1 tablet (20 mg total) by mouth at bedtime.   RYBELSUS  7 MG TABS TAKE 1 TABLET (7 MG TOTAL) BY MOUTH DAILY BEFORE BREAKFAST.   sertraline (ZOLOFT) 50 MG tablet Take 50 mg by mouth daily.

## 2024-11-11 ENCOUNTER — Ambulatory Visit: Admitting: Cardiology

## 2024-11-11 ENCOUNTER — Encounter: Payer: Self-pay | Admitting: Cardiology

## 2024-11-11 VITALS — BP 112/70 | HR 60 | Ht 62.0 in | Wt 153.2 lb

## 2024-11-11 DIAGNOSIS — R079 Chest pain, unspecified: Secondary | ICD-10-CM

## 2024-11-11 DIAGNOSIS — E1169 Type 2 diabetes mellitus with other specified complication: Secondary | ICD-10-CM

## 2024-11-11 DIAGNOSIS — R072 Precordial pain: Secondary | ICD-10-CM

## 2024-11-11 DIAGNOSIS — E785 Hyperlipidemia, unspecified: Secondary | ICD-10-CM

## 2024-11-11 DIAGNOSIS — I10 Essential (primary) hypertension: Secondary | ICD-10-CM

## 2024-11-11 MED ORDER — NITROGLYCERIN 0.4 MG SL SUBL: 0.4000 mg | SUBLINGUAL_TABLET | SUBLINGUAL | 1 refills | Status: AC | PRN

## 2024-11-11 NOTE — Patient Instructions (Signed)
 Medication Instructions:  Your physician has recommended you make the following change in your medication:   START: Nitroglycerin  0.4 mg under the tongue every 5 minutes x 3 doses as needed for chest pain.  *If you need a refill on your cardiac medications before your next appointment, please call your pharmacy*  Lab Work: Your physician recommends that you return for lab work in:   Labs today: BMP  If you have labs (blood work) drawn today and your tests are completely normal, you will receive your results only by: MyChart Message (if you have MyChart) OR A paper copy in the mail If you have any lab test that is abnormal or we need to change your treatment, we will call you to review the results.  Testing/Procedures:   Your cardiac CT will be scheduled at one of the below locations:   [facility] [address] [place], [place] [address] [phone] (Severe contrast allergies only)  OR   [facility] [address] [place], [place] [address] [phone]  OR   [facility] [address] [place], [place] [address] [phone]  OR   [facility]. [facility] and Vascular Tower [address]  [place], [place] [address]  OR   [facility] [address] [place], [place] [id number]  If scheduled at [facility], please arrive at the [facility] and Children's Entrance (Entrance C2) of [facility] 30 minutes prior to test start time. You can use the FREE valet parking offered at entrance C (encouraged to control the heart rate for the test)  Proceed to the [facility] Radiology Department (first floor) to check-in and test prep.  All radiology patients and guests should use entrance C2 at [facility], accessed from [facility], even though the hospital's physical address listed is [address]  If scheduled at the Heart and Vascular Tower at [address], please  enter the parking lot using the [address] entrance and use the FREE valet service at the patient drop-off area. Enter the building and check-in with registration on the main floor.  If scheduled at [facility], please arrive to the Heart and Vascular Center 15 mins early for check-in and test prep.  There is spacious parking and easy access to the radiology department from the [facility] Heart and Vascular entrance. Please enter here and check-in with the desk attendant.   If scheduled at [facility], please arrive 30 minutes early for check-in and test prep.  Please follow these instructions carefully (unless otherwise directed):  An IV will be required for this test and Nitroglycerin  will be given.  Hold all erectile dysfunction medications at least 3 days (72 hrs) prior to test. (Ie viagra, cialis, sildenafil, tadalafil, etc)   On the Night Before the Test: Be sure to Drink plenty of water. Do not consume any caffeinated/decaffeinated beverages or chocolate 12 hours prior to your test. Do not take any antihistamines 12 hours prior to your test.  On the Day of the Test: Drink plenty of water until 1 hour prior to the test. Do not eat any food 1 hour prior to test. You may take your regular medications prior to the test.  Patients who wear a continuous glucose monitor MUST remove the device prior to scanning. FEMALES- please wear underwire-free bra if available, avoid dresses & tight clothing       After the Test: Drink plenty of water.  After receiving IV contrast, you may experience a mild flushed feeling. This is normal. On occasion, you may experience a mild rash up to 24 hours after the test. This is not dangerous. If this occurs, you can take Benadryl  25 mg, Zyrtec, Claritin, or Allegra and increase your fluid intake. (Patients taking Tikosyn should avoid Benadryl , and may take Zyrtec, Claritin, or Allegra) If you experience trouble breathing, this can  be serious. If it is severe call 911 IMMEDIATELY. If it is mild, please call our office.  We will call to schedule your test 2-4 weeks out understanding that some insurance companies will need an authorization prior to the service being performed.   For more information and frequently asked questions, please visit our website : [email or web address]  For non-scheduling related questions, please contact the cardiac imaging nurse navigator should you have any questions/concerns: Cardiac Imaging Nurse Navigators Direct Office Dial: [phone]   For scheduling needs, including cancellations and rescheduling, please call Brittany, [phone].   Follow-Up: At [facility], you and your health needs are our priority.  As part of our continuing mission to provide you with exceptional heart care, our providers are all part of one team.  This team includes your primary Cardiologist (physician) and Advanced Practice Providers or APPs (Physician Assistants and Nurse Practitioners) who all work together to provide you with the care you need, when you need it.  Your next appointment:   3 month(s)  Provider:   Redell Leiter, MD    We recommend signing up for the patient portal called MyChart.  Sign up information is provided on this After Visit Summary.  MyChart is used to connect with patients for Virtual Visits (Telemedicine).  Patients are able to view lab/test results, encounter notes, upcoming appointments, etc.  Non-urgent messages can be sent to your provider as well.   To learn more about what you can do with MyChart, go to [email or web address].   Other Instructions None

## 2024-11-12 ENCOUNTER — Ambulatory Visit: Payer: Self-pay | Admitting: Cardiology

## 2024-11-12 LAB — BASIC METABOLIC PANEL WITH GFR
BUN/Creatinine Ratio: 13 (ref 12–28)
BUN: 13 mg/dL (ref 8–27)
CO2: 24 mmol/L (ref 20–29)
Calcium: 9.2 mg/dL (ref 8.7–10.3)
Chloride: 101 mmol/L (ref 96–106)
Creatinine, Ser: 1.03 mg/dL — ABNORMAL HIGH (ref 0.57–1.00)
Glucose: 106 mg/dL — ABNORMAL HIGH (ref 70–99)
Potassium: 4.7 mmol/L (ref 3.5–5.2)
Sodium: 139 mmol/L (ref 134–144)
eGFR: 58 mL/min/1.73 — ABNORMAL LOW (ref >59)

## 2025-02-09 ENCOUNTER — Ambulatory Visit: Admitting: Cardiology
